# Patient Record
Sex: Female | Born: 1970 | Race: White | Hispanic: No | Marital: Married | Smoking: Never smoker
Health system: Southern US, Community
[De-identification: ages and names within clinical notes are randomized; demographics above are authoritative.]

## PROBLEM LIST (undated history)

## (undated) DIAGNOSIS — N83209 Unspecified ovarian cyst, unspecified side: Secondary | ICD-10-CM

## (undated) DIAGNOSIS — N920 Excessive and frequent menstruation with regular cycle: Secondary | ICD-10-CM

## (undated) DIAGNOSIS — K279 Peptic ulcer, site unspecified, unspecified as acute or chronic, without hemorrhage or perforation: Secondary | ICD-10-CM

## (undated) DIAGNOSIS — E7800 Pure hypercholesterolemia, unspecified: Secondary | ICD-10-CM

## (undated) DIAGNOSIS — D649 Anemia, unspecified: Secondary | ICD-10-CM

## (undated) HISTORY — PX: OTHER SURGICAL HISTORY: SHX169

## (undated) HISTORY — DX: Excessive and frequent menstruation with regular cycle: N92.0

## (undated) HISTORY — DX: Unspecified ovarian cyst, unspecified side: N83.209

## (undated) HISTORY — DX: Pure hypercholesterolemia, unspecified: E78.00

## (undated) HISTORY — DX: Peptic ulcer, site unspecified, unspecified as acute or chronic, without hemorrhage or perforation: K27.9

---

## 2005-06-06 ENCOUNTER — Inpatient Hospital Stay: Payer: Self-pay | Admitting: Obstetrics and Gynecology

## 2007-11-20 ENCOUNTER — Ambulatory Visit: Payer: Self-pay | Admitting: Internal Medicine

## 2007-11-20 ENCOUNTER — Emergency Department: Payer: Self-pay | Admitting: Emergency Medicine

## 2007-11-20 ENCOUNTER — Other Ambulatory Visit: Payer: Self-pay

## 2007-11-24 ENCOUNTER — Ambulatory Visit: Payer: Self-pay | Admitting: Ophthalmology

## 2009-03-19 ENCOUNTER — Ambulatory Visit: Payer: Self-pay

## 2009-03-21 ENCOUNTER — Ambulatory Visit: Payer: Self-pay

## 2009-07-21 HISTORY — PX: OOPHORECTOMY: SHX86

## 2009-07-21 HISTORY — PX: TUBAL LIGATION: SHX77

## 2010-04-24 ENCOUNTER — Ambulatory Visit: Payer: Self-pay

## 2010-05-10 ENCOUNTER — Ambulatory Visit: Payer: Self-pay | Admitting: Obstetrics and Gynecology

## 2010-05-17 ENCOUNTER — Ambulatory Visit: Payer: Self-pay | Admitting: Obstetrics and Gynecology

## 2012-10-01 LAB — HM PAP SMEAR: HM Pap smear: NEGATIVE

## 2013-09-20 ENCOUNTER — Ambulatory Visit: Payer: Self-pay | Admitting: Obstetrics and Gynecology

## 2013-10-07 LAB — BASIC METABOLIC PANEL
Glucose: 88 mg/dL
Potassium: 3.9 mmol/L (ref 3.4–5.3)

## 2013-10-07 LAB — HEMOGLOBIN A1C: Hgb A1c MFr Bld: 5.5 % (ref 4.0–6.0)

## 2013-10-07 LAB — LIPID PANEL
Cholesterol: 232 mg/dL — AB (ref 0–200)
HDL: 44 mg/dL (ref 35–70)
LDL Cholesterol: 170 mg/dL
Triglycerides: 92 mg/dL (ref 40–160)

## 2013-10-07 LAB — TSH: TSH: 1.25 u[IU]/mL (ref 0.41–5.90)

## 2015-01-31 ENCOUNTER — Encounter: Payer: Self-pay | Admitting: Obstetrics and Gynecology

## 2015-03-16 ENCOUNTER — Encounter: Payer: Self-pay | Admitting: *Deleted

## 2015-03-16 DIAGNOSIS — D500 Iron deficiency anemia secondary to blood loss (chronic): Secondary | ICD-10-CM

## 2015-03-21 ENCOUNTER — Encounter: Payer: Self-pay | Admitting: Obstetrics and Gynecology

## 2015-03-22 ENCOUNTER — Telehealth: Payer: Self-pay | Admitting: Obstetrics and Gynecology

## 2015-03-22 NOTE — Telephone Encounter (Signed)
Notified pt she voiced understanding 

## 2015-03-22 NOTE — Telephone Encounter (Signed)
PT IS COMING IN ON 9/16 FOR A F/U AND SHE FEELS THAT HER VITAMIN D IS LOW AND SHE WANTED TO KNOW HOW MUCH VITAMIN D SHE SHOULD TAKE OVER THE COUNTER UNTIL SHE COMES IN FOR HER VISIT.

## 2015-03-22 NOTE — Telephone Encounter (Signed)
5000 IU daily would work well.

## 2015-04-06 ENCOUNTER — Ambulatory Visit (INDEPENDENT_AMBULATORY_CARE_PROVIDER_SITE_OTHER): Payer: 59 | Admitting: Obstetrics and Gynecology

## 2015-04-06 ENCOUNTER — Other Ambulatory Visit: Payer: Self-pay | Admitting: Obstetrics and Gynecology

## 2015-04-06 ENCOUNTER — Encounter: Payer: Self-pay | Admitting: Obstetrics and Gynecology

## 2015-04-06 VITALS — BP 128/62 | HR 81 | Ht 61.0 in | Wt 182.5 lb

## 2015-04-06 DIAGNOSIS — E559 Vitamin D deficiency, unspecified: Secondary | ICD-10-CM

## 2015-04-06 DIAGNOSIS — Z01419 Encounter for gynecological examination (general) (routine) without abnormal findings: Secondary | ICD-10-CM | POA: Diagnosis not present

## 2015-04-06 DIAGNOSIS — N920 Excessive and frequent menstruation with regular cycle: Secondary | ICD-10-CM

## 2015-04-06 DIAGNOSIS — R5383 Other fatigue: Secondary | ICD-10-CM | POA: Diagnosis not present

## 2015-04-06 NOTE — Progress Notes (Signed)
ANNUAL PREVENTATIVE CARE GYN  ENCOUNTER NOTE  Subjective:       Misty Welch is a 44 y.o. No obstetric history on file. female here for a routine annual gynecologic exam.  Current complaints: 1.  Continued heavy menstrual periods with large clots monthly 2. Fatigue and depression around menses     Gynecologic History Patient's last menstrual period was 03/26/2015 (exact date). Contraception: tubal ligation Last Pap: 2015. Results were: normal Last mammogram: 2015. Results were: normal  Obstetric History OB History  No data available    Past Medical History  Diagnosis Date  . Elevated cholesterol   . Cyst of ovary   . Heavy menstrual bleeding     Past Surgical History  Procedure Laterality Date  . Tubal ligation  2011  . Oophorectomy  2011  . Cesarean section  2003,2006    No current outpatient prescriptions on file prior to visit.   No current facility-administered medications on file prior to visit.    Allergies  Allergen Reactions  . Macrobid [employer] Macro]   . Penicillins     Social History   Social History  . Marital Status: Married    Spouse Name: N/A  . Number of Children: N/A  . Years of Education: N/A   Occupational History  . Not on file.   Social History Main Topics  . Smoking status: Never Smoker   . Smokeless tobacco: Never Used  . Alcohol Use: No  . Drug Use: No  . Sexual Activity: Yes    Birth Control/ Protection: Surgical   Other Topics Concern  . Not on file   Social History Narrative    History reviewed. No pertinent family history.  The following portions of the patient's history were reviewed and updated as appropriate: allergies, current medications, past family history, past medical history, past social history, past surgical history and problem list.  Review of Systems ROS Review of Systems - General ROS: negative for - chills, fatigue, fever, hot flashes, night sweats, weight gain or weight  loss Psychological ROS: negative for - anxiety, decreased libido, depression, mood swings, physical abuse or sexual abuse Ophthalmic ROS: negative for - blurry vision, eye pain or loss of vision ENT ROS: negative for - headaches, hearing change, visual changes or vocal changes Allergy and Immunology ROS: negative for - hives, itchy/watery eyes or seasonal allergies Hematological and Lymphatic ROS: negative for - bleeding problems, bruising, swollen lymph nodes or weight loss Endocrine ROS: negative for - galactorrhea, hair pattern changes, hot flashes, malaise/lethargy, mood swings, palpitations, polydipsia/polyuria, skin changes, temperature intolerance or unexpected weight changes Breast ROS: negative for - new or changing breast lumps or nipple discharge Respiratory ROS: negative for - cough or shortness of breath Cardiovascular ROS: negative for - chest pain, irregular heartbeat, palpitations or shortness of breath Gastrointestinal ROS: no abdominal pain, change in bowel habits, or black or bloody stools Genito-Urinary ROS: no dysuria, trouble voiding, or hematuria Musculoskeletal ROS: negative for - joint pain or joint stiffness Neurological ROS: negative for - bowel and bladder control changes Dermatological ROS: negative for rash and skin lesion changes   Objective:   BP 128/62 mmHg  Pulse 81  Ht  (1.549 m)  Wt 182 lb 8 oz (82.781 kg)  BMI 34.50 kg/m2  LMP 03/26/2015 (Exact Date) CONSTITUTIONAL: Well-developed, well-nourished female in no acute distress.  PSYCHIATRIC: Normal mood and affect. Normal behavior. Normal judgment and thought content. NEUROLGIC: Alert and oriented to person, place, and time. Normal muscle  tone coordination. No cranial nerve deficit noted. HENT:  Normocephalic, atraumatic, External right and left ear normal. Oropharynx is clear and moist EYES: Conjunctivae and EOM are normal. Pupils are equal, round, and reactive to light. No scleral icterus.  NECK:  Normal range of motion, supple, no masses.  Normal thyroid.  SKIN: Skin is warm and dry. No rash noted. Not diaphoretic. No erythema. No pallor. CARDIOVASCULAR: Normal heart rate noted, regular rhythm, no murmur. RESPIRATORY: Clear to auscultation bilaterally. Effort and breath sounds normal, no problems with respiration noted. BREASTS: Symmetric in size. No masses, skin changes, nipple drainage, or lymphadenopathy. ABDOMEN: Soft, normal bowel sounds, no distention noted.  No tenderness, rebound or guarding.  BLADDER: Normal PELVIC:  External Genitalia: Normal  BUS: Normal  Vagina: Normal  Cervix: Normal  Uterus: Normal  Adnexa: Normal  RV: External Exam NormaI  MUSCULOSKELETAL: Normal range of motion. No tenderness.  No cyanosis, clubbing, or edema.  2+ distal pulses. LYMPHATIC: No Axillary, Supraclavicular, or Inguinal Adenopathy.    Assessment:   Annual gynecologic examination 44 y.o. Contraception: tubal ligation Obesity 2 Problem List Items Addressed This Visit    None    Visit Diagnoses    Well woman exam with routine gynecological exam    -  Primary    Relevant Orders    Thyroid Panel With TSH    Lipid panel    Comprehensive metabolic panel    Cytology - PAP    Hemoglobin A1c    MM DIGITAL SCREENING BILATERAL    Menorrhagia with regular cycle        Relevant Orders    CBC    Vitamin D deficiency        Relevant Orders    Vit D  25 hydroxy (rtn osteoporosis monitoring)    Other fatigue        Relevant Orders    B12       Plan:  Pap: Pap Co Test Mammogram: Ordered Stool Guaiac Testing:  Not Indicated Labs: Lipid 1, TSH, Hemoglobin A1C and Vit D Level""with vitamin B12 and comprehensive metabolic and CBC Routine preventative health maintenance measures emphasized: Exercise/Diet/Weight control and Stress Management Still not interested in surgical intervention for menorrhagia Return to Clinic - 1 Year   Abbigaile Rockman [place], [place]

## 2015-04-06 NOTE — Patient Instructions (Signed)
Thank you for enrolling in MyChart. Please follow the instructions below to securely access your online medical record. MyChart allows you to send messages to your doctor, view your test results, renew your prescriptions, schedule appointments, and more.  How Do I Sign Up? 1. In your Internet browser, go to [email or web address] 2. Click on the New  User? link in the Sign In box.  3. Enter your MyChart Access Code exactly as it appears below. You will not need to use this code after you have completed the sign-up process. If you do not sign up before the expiration date, you must request a new code. MyChart Access Code: [email or web address] Expires: 06/05/2015 10:26 AM  4. Enter the last four digits of your Social Security Number (xxxx) and Date of Birth (mm/dd/yyyy) as indicated and click Next. You will be taken to the next sign-up page. 5. Create a MyChart ID. This will be your MyChart login ID and cannot be changed, so think of one that is secure and easy to remember. 6. Create a MyChart password. You can change your password at any time. 7. Enter your Password Reset Question and Answer and click Next. This can be used at a later time if you forget your password.  8. Select your communication preference, and if applicable enter your e-mail address. You will receive e-mail notification when new information is available in MyChart by choosing to receive e-mail notifications and filling in your e-mail. 9. Click Sign In. You can now view your medical record.   Additional Information If you have questions, you can email [email or web address] or call [phone] to talk to our MyChart staff. Remember, MyChart is NOT to be used for urgent needs. For medical emergencies, dial 911.    Fat and Cholesterol Control Diet Fat and cholesterol levels in your blood and organs are influenced by your diet. High levels of fat and cholesterol may lead to diseases of the heart, small and  large blood vessels, gallbladder, liver, and pancreas. CONTROLLING FAT AND CHOLESTEROL WITH DIET Although exercise and lifestyle factors are important, your diet is key. That is because certain foods are known to raise cholesterol and others to lower it. The goal is to balance foods for their effect on cholesterol and more importantly, to replace saturated and trans fat with other types of fat, such as monounsaturated fat, polyunsaturated fat, and omega-3 fatty acids. On average, a person should consume no more than 15 to 17 g of saturated fat daily. Saturated and trans fats are considered "bad" fats, and they will raise LDL cholesterol. Saturated fats are primarily found in animal products such as meats, butter, and cream. However, that does not mean you need to give up all your favorite foods. Today, there are good tasting, low-fat, low-cholesterol substitutes for most of the things you like to eat. Choose low-fat or nonfat alternatives. Choose round or loin cuts of red meat. These types of cuts are lowest in fat and cholesterol. Chicken (without the skin), fish, veal, and ground [place] breast are great choices. Eliminate fatty meats, such as hot dogs and salami. Even shellfish have little or no saturated fat. Have a 3 oz (85 g) portion when you eat lean meat, poultry, or fish. Trans fats are also called "partially hydrogenated oils." They are oils that have been scientifically manipulated so that they are solid at room temperature resulting in a longer shelf life and improved taste and texture of foods in which they are added. Trans  fats are found in stick margarine, some tub margarines, cookies, crackers, and baked goods.  When baking and cooking, oils are a great substitute for butter. The monounsaturated oils are especially beneficial since it is believed they lower LDL and raise HDL. The oils you should avoid entirely are saturated tropical oils, such as coconut and palm.  Remember to eat a lot from  food groups that are naturally free of saturated and trans fat, including fish, fruit, vegetables, beans, grains (barley, rice, couscous, bulgur wheat), and pasta (without cream sauces).  IDENTIFYING FOODS THAT LOWER FAT AND CHOLESTEROL  Soluble fiber may lower your cholesterol. This type of fiber is found in fruits such as apples, vegetables such as broccoli, potatoes, and carrots, legumes such as beans, peas, and lentils, and grains such as barley. Foods fortified with plant sterols (phytosterol) may also lower cholesterol. You should eat at least 2 g per day of these foods for a cholesterol lowering effect.  Read package labels to identify low-saturated fats, trans fat free, and low-fat foods at the supermarket. Select cheeses that have only 2 to 3 g saturated fat per ounce. Use a heart-healthy tub margarine that is free of trans fats or partially hydrogenated oil. When buying baked goods (cookies, crackers), avoid partially hydrogenated oils. Breads and muffins should be made from whole grains (whole-wheat or whole oat flour, instead of "flour" or "enriched flour"). Buy non-creamy canned soups with reduced salt and no added fats.  FOOD PREPARATION TECHNIQUES  Never deep-fry. If you must fry, either stir-fry, which uses very little fat, or use non-stick cooking sprays. When possible, broil, bake, or roast meats, and steam vegetables. Instead of putting butter or margarine on vegetables, use lemon and herbs, applesauce, and cinnamon (for squash and sweet potatoes). Use nonfat yogurt, salsa, and low-fat dressings for salads.  LOW-SATURATED FAT / LOW-FAT FOOD SUBSTITUTES Meats / Saturated Fat (g)  Avoid: Steak, marbled (3 oz/85 g) / 11 g  Choose: Steak, lean (3 oz/85 g) / 4 g  Avoid: Hamburger (3 oz/85 g) / 7 g  Choose: Hamburger, lean (3 oz/85 g) / 5 g  Avoid: Ham (3 oz/85 g) / 6 g  Choose: Ham, lean cut (3 oz/85 g) / 2.4 g  Avoid: Chicken, with skin, dark meat (3 oz/85 g) / 4 g  Choose:  Chicken, skin removed, dark meat (3 oz/85 g) / 2 g  Avoid: Chicken, with skin, light meat (3 oz/85 g) / 2.5 g  Choose: Chicken, skin removed, light meat (3 oz/85 g) / 1 g Dairy / Saturated Fat (g)  Avoid: Whole milk (1 cup) / 5 g  Choose: Low-fat milk, 2% (1 cup) / 3 g  Choose: Low-fat milk, 1% (1 cup) / 1.5 g  Choose: Skim milk (1 cup) / 0.3 g  Avoid: Hard cheese (1 oz/28 g) / 6 g  Choose: Skim milk cheese (1 oz/28 g) / 2 to 3 g  Avoid: Cottage cheese, 4% fat (1 cup) / 6.5 g  Choose: Low-fat cottage cheese, 1% fat (1 cup) / 1.5 g  Avoid: Ice cream (1 cup) / 9 g  Choose: Sherbet (1 cup) / 2.5 g  Choose: Nonfat frozen yogurt (1 cup) / 0.3 g  Choose: Frozen fruit bar / trace  Avoid: Whipped cream (1 tbs) / 3.5 g  Choose: Nondairy whipped topping (1 tbs) / 1 g Condiments / Saturated Fat (g)  Avoid: Mayonnaise (1 tbs) / 2 g  Choose: Low-fat mayonnaise (1 tbs) / 1 g  Avoid: Butter (1 tbs) / 7 g  Choose: Extra light margarine (1 tbs) / 1 g  Avoid: Coconut oil (1 tbs) / 11.8 g  Choose: Olive oil (1 tbs) / 1.8 g  Choose: Corn oil (1 tbs) / 1.7 g  Choose: Safflower oil (1 tbs) / 1.2 g  Choose: Sunflower oil (1 tbs) / 1.4 g  Choose: Soybean oil (1 tbs) / 2.4 g  Choose: Canola oil (1 tbs) / 1 g Document Released: 07/07/2005 Document Revised: 11/01/2012 Document Reviewed: 10/05/2013 Exit[employer] Patient Information 2015 [place], [place]. This information is not intended to replace advice given to you by your health care provider. Make sure you discuss any questions you have with your health care provider. Menorrhagia Menorrhagia is the medical term for when your menstrual periods are heavy or last longer than usual. With menorrhagia, every period you have may cause enough blood loss and cramping that you are unable to maintain your usual activities. CAUSES  In some cases, the cause of heavy periods is unknown, but a number of conditions may cause menorrhagia. Common  causes include:  A problem with the hormone-producing thyroid gland (hypothyroid).  Noncancerous growths in the uterus (polyps or fibroids).  An imbalance of the estrogen and progesterone hormones.  One of your ovaries not releasing an egg during one or more months.  Side effects of having an intrauterine device (IUD).  Side effects of some medicines, such as anti-inflammatory medicines or blood thinners.  A bleeding disorder that stops your blood from clotting normally. SIGNS AND SYMPTOMS  During a normal period, bleeding lasts between 4 and 8 days. Signs that your periods are too heavy include:  You routinely have to change your pad or tampon every 1 or 2 hours because it is completely soaked.  You pass blood clots larger than 1 inch (2.5 cm) in size.  You have bleeding for more than 7 days.  You need to use pads and tampons at the same time because of heavy bleeding.  You need to wake up to change your pads or tampons during the night.  You have symptoms of anemia, such as tiredness, fatigue, or shortness of breath. DIAGNOSIS  Your health care provider will perform a physical exam and ask you questions about your symptoms and menstrual history. Other tests may be ordered based on what the health care provider finds during the exam. These tests can include:  Blood tests. Blood tests are used to check if you are pregnant or have hormonal changes, a bleeding or thyroid disorder, low iron levels (anemia), or other problems.  Endometrial biopsy. Your health care provider takes a sample of tissue from the inside of your uterus to be examined under a microscope.  Pelvic ultrasound. This test uses sound waves to make a picture of your uterus, ovaries, and vagina. The pictures can show if you have fibroids or other growths.  Hysteroscopy. For this test, your health care provider will use a small telescope to look inside your uterus. Based on the results of your initial tests, your  health care provider may recommend further testing. TREATMENT  Treatment may not be needed. If it is needed, your health care provider may recommend treatment with one or more medicines first. If these do not reduce bleeding enough, a surgical treatment might be an option. The best treatment for you will depend on:   Whether you need to prevent pregnancy.  Your desire to have children in the future.  The cause and severity of  your bleeding.  Your opinion and personal preference.  Medicines for menorrhagia may include:  Birth control methods that use hormones. These include the pill, skin patch, vaginal ring, shots that you get every 3 months, hormonal IUD, and implant. These treatments reduce bleeding during your menstrual period.  Medicines that thicken blood and slow bleeding.  Medicines that reduce swelling, such as ibuprofen.  Medicines that contain a synthetic hormone called progestin.   Medicines that make the ovaries stop working for a short time.  You may need surgical treatment for menorrhagia if the medicines are unsuccessful. Treatment options include:  Dilation and curettage (D&C). In this procedure, your health care provider opens (dilates) your cervix and then scrapes or suctions tissue from the lining of your uterus to reduce menstrual bleeding.  Operative hysteroscopy. This procedure uses a tiny tube with a light (hysteroscope) to view your uterine cavity and can help in the surgical removal of a polyp that may be causing heavy periods.  Endometrial ablation. Through various techniques, your health care provider permanently destroys the entire lining of your uterus (endometrium). After endometrial ablation, most women have little or no menstrual flow. Endometrial ablation reduces your ability to become pregnant.  Endometrial resection. This surgical procedure uses an electrosurgical wire loop to remove the lining of the uterus. This procedure also reduces your  ability to become pregnant.  Hysterectomy. Surgical removal of the uterus and cervix is a permanent procedure that stops menstrual periods. Pregnancy is not possible after a hysterectomy. This procedure requires anesthesia and hospitalization. HOME CARE INSTRUCTIONS   Only take over-the-counter or prescription medicines as directed by your health care provider. Take prescribed medicines exactly as directed. Do not change or switch medicines without consulting your health care provider.  Take any prescribed iron pills exactly as directed by your health care provider. Long-term heavy bleeding may result in low iron levels. Iron pills help replace the iron your body lost from heavy bleeding. Iron may cause constipation. If this becomes a problem, increase the bran, fruits, and roughage in your diet.  Do not take aspirin or medicines that contain aspirin 1 week before or during your menstrual period. Aspirin may make the bleeding worse.  If you need to change your sanitary pad or tampon more than once every 2 hours, stay in bed and rest as much as possible until the bleeding stops.  Eat well-balanced meals. Eat foods high in iron. Examples are leafy green vegetables, meat, liver, eggs, and whole grain breads and cereals. Do not try to lose weight until the abnormal bleeding has stopped and your blood iron level is back to normal. SEEK MEDICAL CARE IF:   You soak through a pad or tampon every 1 or 2 hours, and this happens every time you have a period.  You need to use pads and tampons at the same time because you are bleeding so much.  You need to change your pad or tampon during the night.  You have a period that lasts for more than 8 days.  You pass clots bigger than 1 inch wide.  You have irregular periods that happen more or less often than once a month.  You feel dizzy or faint.  You feel very weak or tired.  You feel short of breath or feel your heart is beating too fast when you  exercise.  You have nausea and vomiting or diarrhea while you are taking your medicine.  You have any problems that may be related to the  medicine you are taking. SEEK IMMEDIATE MEDICAL CARE IF:   You soak through 4 or more pads or tampons in 2 hours.  You have any bleeding while you are pregnant. MAKE SURE YOU:   Understand these instructions.  Will watch your condition.  Will get help right away if you are not doing well or get worse. Document Released: 07/07/2005 Document Revised: 07/12/2013 Document Reviewed: 12/26/2012 [facility] Patient Information 2015 [place], [place]. This information is not intended to replace advice given to you by your health care provider. Make sure you discuss any questions you have with your health care provider.

## 2015-04-07 LAB — COMPREHENSIVE METABOLIC PANEL
ALK PHOS: 73 IU/L (ref 39–117)
ALT: 14 IU/L (ref 0–32)
AST: 20 IU/L (ref 0–40)
Albumin/Globulin Ratio: 1.4 (ref 1.1–2.5)
Albumin: 4.3 g/dL (ref 3.5–5.5)
BILIRUBIN TOTAL: 0.3 mg/dL (ref 0.0–1.2)
BUN/Creatinine Ratio: 10 (ref 9–23)
BUN: 7 mg/dL (ref 6–24)
CHLORIDE: 100 mmol/L (ref 97–108)
CO2: 22 mmol/L (ref 18–29)
CREATININE: 0.67 mg/dL (ref 0.57–1.00)
Calcium: 9.2 mg/dL (ref 8.7–10.2)
GFR calc Af Amer: 125 mL/min/{1.73_m2} (ref >59)
GFR calc non Af Amer: 109 mL/min/{1.73_m2} (ref >59)
GLUCOSE: 83 mg/dL (ref 65–99)
Globulin, Total: 3 g/dL (ref 1.5–4.5)
Potassium: 4.2 mmol/L (ref 3.5–5.2)
Sodium: 138 mmol/L (ref 134–144)
Total Protein: 7.3 g/dL (ref 6.0–8.5)

## 2015-04-07 LAB — CBC
HEMATOCRIT: 26.8 % — AB (ref 34.0–46.6)
Hemoglobin: 7.8 g/dL — ABNORMAL LOW (ref 11.1–15.9)
MCH: 18.4 pg — ABNORMAL LOW (ref 26.6–33.0)
MCHC: 29.1 g/dL — ABNORMAL LOW (ref 31.5–35.7)
MCV: 63 fL — AB (ref 79–97)
Platelets: 425 10*3/uL — ABNORMAL HIGH (ref 150–379)
RBC: 4.23 x10E6/uL (ref 3.77–5.28)
RDW: 18 % — ABNORMAL HIGH (ref 12.3–15.4)
WBC: 8 10*3/uL (ref 3.4–10.8)

## 2015-04-07 LAB — THYROID PANEL WITH TSH
Free Thyroxine Index: 2.2 (ref 1.2–4.9)
T3 Uptake Ratio: 26 % (ref 24–39)
T4, Total: 8.3 ug/dL (ref 4.5–12.0)
TSH: 1.54 u[IU]/mL (ref 0.450–4.500)

## 2015-04-07 LAB — VITAMIN D 25 HYDROXY (VIT D DEFICIENCY, FRACTURES): Vit D, 25-Hydroxy: 30.5 ng/mL (ref 30.0–100.0)

## 2015-04-07 LAB — LIPID PANEL
CHOL/HDL RATIO: 4.7 ratio — AB (ref 0.0–4.4)
Cholesterol, Total: 207 mg/dL — ABNORMAL HIGH (ref 100–199)
HDL: 44 mg/dL (ref >39)
LDL CALC: 146 mg/dL — AB (ref 0–99)
Triglycerides: 86 mg/dL (ref 0–149)
VLDL CHOLESTEROL CAL: 17 mg/dL (ref 5–40)

## 2015-04-07 LAB — HEMOGLOBIN A1C
Est. average glucose Bld gHb Est-mCnc: 103 mg/dL
Hgb A1c MFr Bld: 5.2 % (ref 4.8–5.6)

## 2015-04-07 LAB — VITAMIN B12: Vitamin B-12: 563 pg/mL (ref 211–946)

## 2015-04-09 ENCOUNTER — Other Ambulatory Visit: Payer: Self-pay | Admitting: Obstetrics and Gynecology

## 2015-04-09 DIAGNOSIS — D500 Iron deficiency anemia secondary to blood loss (chronic): Secondary | ICD-10-CM

## 2015-04-09 LAB — CYTOLOGY - PAP

## 2015-04-09 MED ORDER — FUSION PLUS PO CAPS: 1.0000 | ORAL_CAPSULE | Freq: Every day | ORAL | Status: DC

## 2015-04-10 ENCOUNTER — Ambulatory Visit
Admission: RE | Admit: 2015-04-10 | Discharge: 2015-04-10 | Disposition: A | Discharge status: Home / Self Care | Payer: 59 | Source: Ambulatory Visit | Attending: Obstetrics and Gynecology | Admitting: Obstetrics and Gynecology

## 2015-04-10 DIAGNOSIS — Z01419 Encounter for gynecological examination (general) (routine) without abnormal findings: Secondary | ICD-10-CM

## 2015-04-10 DIAGNOSIS — Z1231 Encounter for screening mammogram for malignant neoplasm of breast: Secondary | ICD-10-CM | POA: Diagnosis not present

## 2015-04-12 ENCOUNTER — Telehealth: Payer: Self-pay | Admitting: Obstetrics and Gynecology

## 2015-04-12 ENCOUNTER — Telehealth: Payer: Self-pay | Admitting: *Deleted

## 2015-04-12 NOTE — Telephone Encounter (Signed)
Notified pt about her lab results and how important it is for her to take her iron tablet, pt voiced understanding and will come for repeat labs in 6 weeks Understands to call our office with any questions

## 2015-04-12 NOTE — Telephone Encounter (Signed)
-----   Message from Ulyses Amor, [place] sent at 04/10/2015  1:43 PM EDT ----- Please let her know I have reviewed her MMG and it is normal

## 2015-04-12 NOTE — Telephone Encounter (Signed)
Pt wants to know her vit D level

## 2015-04-12 NOTE — Telephone Encounter (Signed)
-----   Message from Ulyses Amor, [place] sent at 04/09/2015  4:30 PM EDT ----- Please let her know she is very anemic again- so low she would normally be recommended a transfusion- I sent in new rx for iron supplement, and want to recheck levels in 6 weeks- orders are in computer.  Would like her to reconsider treating heavy menses with either hormones or surgery.  Also her cholesterol is elevated- want her to work on weight loss and low chol diet.

## 2015-04-12 NOTE — Telephone Encounter (Signed)
Notified pt of results 

## 2015-04-13 ENCOUNTER — Telehealth: Payer: Self-pay | Admitting: Obstetrics and Gynecology

## 2015-04-13 NOTE — Telephone Encounter (Signed)
Patient called with questions regarding the iron supplement she was given.Thanks

## 2015-04-13 NOTE — Telephone Encounter (Signed)
Pt called back Misty Welch gave her Vit D result

## 2015-04-16 NOTE — Telephone Encounter (Signed)
Pt would like code for ablation to call her insurance and see how much it cost

## 2015-06-05 ENCOUNTER — Other Ambulatory Visit: Payer: Self-pay | Admitting: *Deleted

## 2015-06-05 ENCOUNTER — Telehealth: Payer: Self-pay | Admitting: Obstetrics and Gynecology

## 2015-06-05 MED ORDER — NORETHIN-ETH ESTRAD-FE BIPHAS 1 MG-10 MCG / 10 MCG PO TABS: 1.0000 | ORAL_TABLET | Freq: Every day | ORAL | Status: DC

## 2015-06-05 NOTE — Telephone Encounter (Signed)
Mel gave her samples for BC pills and she is out needs RX sent [facility]. She will need then tomorrow.

## 2015-06-05 NOTE — Telephone Encounter (Signed)
Done-ac 

## 2015-06-05 NOTE — Telephone Encounter (Signed)
Do you remember which sample you gave her??

## 2015-06-05 NOTE — Telephone Encounter (Signed)
minastrin or LoLoestrin?

## 2015-06-27 ENCOUNTER — Telehealth: Payer: Self-pay | Admitting: Obstetrics and Gynecology

## 2015-06-27 NOTE — Telephone Encounter (Signed)
Patient would like a call back, she has a couple questions regarding the birth control she was put on at her last visit. You can reach her at [phone]

## 2015-06-27 NOTE — Telephone Encounter (Signed)
Spoke with pt she needed some samples of her bc pills

## 2015-08-16 ENCOUNTER — Telehealth: Payer: Self-pay | Admitting: Obstetrics and Gynecology

## 2015-08-16 NOTE — Telephone Encounter (Signed)
Iron pills ran out. She wants to know if she should stay on them and if so can she have them refilled.

## 2015-08-16 NOTE — Telephone Encounter (Signed)
Pt needs to continue iron correct??

## 2015-08-22 NOTE — Telephone Encounter (Signed)
Yes, I want her to take them until we recheck her levels. The need to be drawn 6 months after last check.

## 2015-08-24 ENCOUNTER — Other Ambulatory Visit: Payer: Self-pay | Admitting: Obstetrics and Gynecology

## 2015-08-24 ENCOUNTER — Other Ambulatory Visit: Payer: 59

## 2015-08-24 ENCOUNTER — Other Ambulatory Visit: Payer: Self-pay | Admitting: *Deleted

## 2015-08-24 MED ORDER — FUSION PLUS PO CAPS: 1.0000 | ORAL_CAPSULE | Freq: Every day | ORAL | Status: DC

## 2015-08-25 LAB — FERRITIN: Ferritin: 47 ng/mL (ref 15–150)

## 2015-09-24 ENCOUNTER — Telehealth: Payer: Self-pay | Admitting: Obstetrics and Gynecology

## 2015-09-24 NOTE — Telephone Encounter (Signed)
Results please?

## 2015-09-24 NOTE — Telephone Encounter (Signed)
PT CALLED AND HAD BLOOD WORK ABOUT 3 WEEKS AGO, SHE STATED NOT SURE IF WE HAD TRIED CALLING HER BECAUSE HER CELL PHONE AND HOME PHONE MAILBOX WAS FULL, BUT SHE SAID SHE WILL TRY TO ANSWER HER PHONE IF YOU CALL HER BACK TODAY.

## 2015-09-25 NOTE — Telephone Encounter (Signed)
Please let her know her levels were normal, and that we have switch to messages about labs going through nmy Chart, I see she hasn't signed up yet, and encourage her to do so.

## 2015-09-27 NOTE — Telephone Encounter (Signed)
Notified pt of results 

## 2015-11-27 ENCOUNTER — Telehealth: Payer: Self-pay | Admitting: Obstetrics and Gynecology

## 2015-11-27 NOTE — Telephone Encounter (Signed)
She wants to ask about a side effect of LO LO

## 2015-11-28 ENCOUNTER — Telehealth: Payer: Self-pay | Admitting: *Deleted

## 2015-11-28 NOTE — Telephone Encounter (Signed)
See other message

## 2015-11-28 NOTE — Telephone Encounter (Signed)
Pt states she feel really bad on the birth control, she is going to stop them and let us know how she does

## 2015-11-28 NOTE — Telephone Encounter (Signed)
Patient states she is having issues with her birth control. She blood pressure has been elevated a little and she has been having headaches. Patient is requesting a call back . Call back number is [phone]

## 2016-02-04 ENCOUNTER — Other Ambulatory Visit: Payer: Self-pay | Admitting: *Deleted

## 2016-02-04 MED ORDER — NORETHIN-ETH ESTRAD-FE BIPHAS 1 MG-10 MCG / 10 MCG PO TABS: 1.0000 | ORAL_TABLET | Freq: Every day | ORAL | Status: DC

## 2016-03-03 ENCOUNTER — Telehealth: Payer: Self-pay | Admitting: Obstetrics and Gynecology

## 2016-03-03 NOTE — Telephone Encounter (Signed)
She wants your thoughts on taking OTC Amberen?

## 2016-03-04 NOTE — Telephone Encounter (Signed)
Looks like some thing for menopause??? pls advise

## 2016-03-04 NOTE — Telephone Encounter (Signed)
I think it is safe product, and should be fine to give it a try. Have about 50% of patients see results with it.

## 2016-03-04 NOTE — Telephone Encounter (Signed)
Left detailed message for pt 

## 2016-04-08 ENCOUNTER — Encounter: Payer: 59 | Admitting: Obstetrics and Gynecology

## 2016-04-09 ENCOUNTER — Encounter: Payer: 59 | Admitting: Obstetrics and Gynecology

## 2016-05-13 ENCOUNTER — Other Ambulatory Visit: Payer: Self-pay | Admitting: Obstetrics and Gynecology

## 2016-06-23 ENCOUNTER — Other Ambulatory Visit: Payer: Self-pay | Admitting: Obstetrics and Gynecology

## 2016-06-23 DIAGNOSIS — Z1231 Encounter for screening mammogram for malignant neoplasm of breast: Secondary | ICD-10-CM

## 2016-07-04 ENCOUNTER — Ambulatory Visit: Admission: RE | Admit: 2016-07-04 | Payer: 59 | Source: Ambulatory Visit

## 2016-07-18 ENCOUNTER — Encounter: Payer: Self-pay | Admitting: Obstetrics and Gynecology

## 2016-07-18 ENCOUNTER — Ambulatory Visit (INDEPENDENT_AMBULATORY_CARE_PROVIDER_SITE_OTHER): Payer: 59 | Admitting: Obstetrics and Gynecology

## 2016-07-18 VITALS — BP 144/87 | HR 92 | Ht 61.0 in | Wt 189.4 lb

## 2016-07-18 DIAGNOSIS — Z01419 Encounter for gynecological examination (general) (routine) without abnormal findings: Secondary | ICD-10-CM | POA: Diagnosis not present

## 2016-07-18 MED ORDER — NORETHIN-ETH ESTRAD-FE BIPHAS 1 MG-10 MCG / 10 MCG PO TABS: 1.0000 | ORAL_TABLET | Freq: Every day | ORAL | 11 refills | Status: DC

## 2016-07-18 NOTE — Progress Notes (Signed)
Subjective:   Misty Welch is a 45 y.o. 42P0 Caucasian female here for a routine well-woman exam.  No LMP recorded. Patient is not currently having periods (Reason: Oral contraceptives).    Current complaints: none now PCP: me       does desire labs  Social History: Sexual: heterosexual Marital Status: married Living situation: with family Occupation: self-employed Tobacco/alcohol: no tobacco use Illicit drugs: no history of illicit drug use  The following portions of the patient's history were reviewed and updated as appropriate: allergies, current medications, past family history, past medical history, past social history, past surgical history and problem list.  Past Medical History Past Medical History:  Diagnosis Date  . Cyst of ovary   . Elevated cholesterol   . Heavy menstrual bleeding     Past Surgical History Past Surgical History:  Procedure Laterality Date  . CESAREAN SECTION  [id number],2006  . OOPHORECTOMY  2011  . TUBAL LIGATION  2011    Gynecologic History G2P0  No LMP recorded. Patient is not currently having periods (Reason: Oral contraceptives). Contraception: OCP (estrogen/progesterone) Last Pap: 2016. Results were: normal Last mammogram: 2016. Results were: normal   Obstetric History OB History  Gravida Para Term Preterm AB Living  2            SAB TAB Ectopic Multiple Live Births               # Outcome Date GA Lbr Len/2nd Weight Sex Delivery Anes PTL Lv  2 Gravida 2006    F CS-Unspec     1 Gravida 2003    M CS-Unspec         Current Medications Current Outpatient Prescriptions on File Prior to Visit  Medication Sig Dispense Refill  . Norethindrone-Ethinyl Estradiol-Fe Biphas (LO LOESTRIN FE) 1 MG-10 MCG / 10 MCG tablet Take 1 tablet by mouth daily. 1 Package 11  . Iron-FA-B Cmp-C-Biot-Probiotic (FUSION PLUS) CAPS Take 1 capsule by mouth daily. (Patient not taking: Reported on 07/18/2016) 60 capsule 3   No current facility-administered  medications on file prior to visit.     Review of Systems Patient denies any headaches, blurred vision, shortness of breath, chest pain, abdominal pain, problems with bowel movements, urination, or intercourse.  Objective:  BP (!) 144/87   Pulse 92   Ht 5\' 1"  (1.549 m)   Wt 189 lb 6.4 oz (85.9 kg)   BMI 35.79 kg/m  Physical Exam  General:  Well developed, well nourished, no acute distress. She is alert and oriented x3. Skin:  Warm and dry Neck:  Midline trachea, no thyromegaly or nodules Cardiovascular: Regular rate and rhythm, no murmur heard Lungs:  Effort normal, all lung fields clear to auscultation bilaterally Breasts:  No dominant palpable mass, retraction, or nipple discharge Abdomen:  Soft, non tender, no hepatosplenomegaly or masses Pelvic:  External genitalia is normal in appearance.  The vagina is normal in appearance. The cervix is bulbous, no CMT.  Thin prep pap is not done. Uterus is felt to be normal size, shape, and contour.  No adnexal masses or tenderness noted.  Extremities:  No swelling or varicosities noted Psych:  She has a normal mood and affect  Assessment:   Healthy well-woman exam Obesity OCP user   Plan:  Declined flu shot F/U 1 year for AE, or sooner if needed Mammogram ordered  Leverett Camplin Suzan NailerN Emireth Cockerham, CNM

## 2016-07-18 NOTE — Patient Instructions (Signed)
Thank you for enrolling in MyChart. Please follow the instructions below to securely access your online medical record. MyChart allows you to send messages to your doctor, view your test results, renew your prescriptions, schedule appointments, and more.  How Do I Sign Up? 1. In your Internet browser, go to [email or web address]. 2. Click on the New  User? link in the Sign In box.  3. Enter your MyChart Access Code exactly as it appears below. You will not need to use this code after you have completed the sign-up process. If you do not sign up before the expiration date, you must request a new code. MyChart Access Code: [email or web address] Expires: 09/16/2016  9:22 AM  4. Enter the last four digits of your Social Security Number (xxxx) and Date of Birth (mm/dd/yyyy) as indicated and click Next. You will be taken to the next sign-up page. 5. Create a MyChart ID. This will be your MyChart login ID and cannot be changed, so think of one that is secure and easy to remember. 6. Create a MyChart password. You can change your password at any time. 7. Enter your Password Reset Question and Answer and click Next. This can be used at a later time if you forget your password.  8. Select your communication preference, and if applicable enter your e-mail address. You will receive e-mail notification when new information is available in MyChart by choosing to receive e-mail notifications and filling in your e-mail. 9. Click Sign In. You can now view your medical record.   Additional Information If you have questions, you can email [email or web address] or call [phone] to talk to our MyChart staff. Remember, MyChart is NOT to be used for urgent needs. For medical emergencies, dial 911.

## 2016-07-19 LAB — COMPREHENSIVE METABOLIC PANEL
A/G RATIO: 1.2 (ref 1.2–2.2)
ALT: 19 IU/L (ref 0–32)
AST: 23 IU/L (ref 0–40)
Albumin: 4.2 g/dL (ref 3.5–5.5)
Alkaline Phosphatase: 65 IU/L (ref 39–117)
BUN/Creatinine Ratio: 9 (ref 9–23)
BUN: 7 mg/dL (ref 6–24)
Bilirubin Total: 0.5 mg/dL (ref 0.0–1.2)
CALCIUM: 9 mg/dL (ref 8.7–10.2)
CHLORIDE: 101 mmol/L (ref 96–106)
CO2: 23 mmol/L (ref 18–29)
Creatinine, Ser: 0.75 mg/dL (ref 0.57–1.00)
GFR, EST AFRICAN AMERICAN: 112 mL/min/{1.73_m2} (ref >59)
GFR, EST NON AFRICAN AMERICAN: 97 mL/min/{1.73_m2} (ref >59)
GLUCOSE: 81 mg/dL (ref 65–99)
Globulin, Total: 3.4 g/dL (ref 1.5–4.5)
POTASSIUM: 4 mmol/L (ref 3.5–5.2)
Sodium: 141 mmol/L (ref 134–144)
TOTAL PROTEIN: 7.6 g/dL (ref 6.0–8.5)

## 2016-07-19 LAB — LIPID PANEL
CHOL/HDL RATIO: 5.5 ratio — AB (ref 0.0–4.4)
Cholesterol, Total: 243 mg/dL — ABNORMAL HIGH (ref 100–199)
HDL: 44 mg/dL (ref >39)
LDL CALC: 182 mg/dL — AB (ref 0–99)
Triglycerides: 87 mg/dL (ref 0–149)
VLDL Cholesterol Cal: 17 mg/dL (ref 5–40)

## 2016-07-19 LAB — IRON: IRON: 87 ug/dL (ref 27–159)

## 2016-07-19 LAB — HEMOGLOBIN A1C
ESTIMATED AVERAGE GLUCOSE: 94 mg/dL
Hgb A1c MFr Bld: 4.9 % (ref 4.8–5.6)

## 2016-07-19 LAB — CBC
Hematocrit: 41.6 % (ref 34.0–46.6)
Hemoglobin: 14.3 g/dL (ref 11.1–15.9)
MCH: 29.4 pg (ref 26.6–33.0)
MCHC: 34.4 g/dL (ref 31.5–35.7)
MCV: 86 fL (ref 79–97)
PLATELETS: 347 10*3/uL (ref 150–379)
RBC: 4.86 x10E6/uL (ref 3.77–5.28)
RDW: 12.4 % (ref 12.3–15.4)
WBC: 7.8 10*3/uL (ref 3.4–10.8)

## 2016-07-19 LAB — VITAMIN D 25 HYDROXY (VIT D DEFICIENCY, FRACTURES): Vit D, 25-Hydroxy: 28.1 ng/mL — ABNORMAL LOW (ref 30.0–100.0)

## 2016-07-19 LAB — VITAMIN B12: VITAMIN B 12: 482 pg/mL (ref 232–1245)

## 2016-07-22 ENCOUNTER — Telehealth: Payer: Self-pay | Admitting: *Deleted

## 2016-07-22 ENCOUNTER — Other Ambulatory Visit: Payer: Self-pay | Admitting: Obstetrics and Gynecology

## 2016-07-22 ENCOUNTER — Ambulatory Visit
Admission: RE | Admit: 2016-07-22 | Discharge: 2016-07-22 | Disposition: A | Discharge status: Home / Self Care | Payer: 59 | Source: Ambulatory Visit | Attending: Obstetrics and Gynecology | Admitting: Obstetrics and Gynecology

## 2016-07-22 DIAGNOSIS — Z1231 Encounter for screening mammogram for malignant neoplasm of breast: Secondary | ICD-10-CM | POA: Insufficient documentation

## 2016-07-22 DIAGNOSIS — E559 Vitamin D deficiency, unspecified: Secondary | ICD-10-CM | POA: Insufficient documentation

## 2016-07-22 MED ORDER — VITAMIN D (ERGOCALCIFEROL) 1.25 MG (50000 UNIT) PO CAPS: 50000.0000 [IU] | ORAL_CAPSULE | ORAL | 1 refills | Status: DC

## 2016-07-22 NOTE — Telephone Encounter (Signed)
Notified pt of results 

## 2016-07-22 NOTE — Telephone Encounter (Signed)
-----   Message from Purcell NailsMelody N Shambley, [place] sent at 07/22/2016 12:51 PM EST ----- Please let her know lab results, se needs to continue to work on lowering cholestero;l. Also vit D is low again. I will send in rx for weekly supplement.

## 2016-07-23 ENCOUNTER — Ambulatory Visit: Admission: RE | Admit: 2016-07-23 | Payer: 59 | Source: Ambulatory Visit

## 2017-02-10 ENCOUNTER — Other Ambulatory Visit: Payer: Self-pay | Admitting: Obstetrics and Gynecology

## 2017-05-23 ENCOUNTER — Observation Stay: Payer: 59

## 2017-05-23 ENCOUNTER — Emergency Department: Payer: 59

## 2017-05-23 ENCOUNTER — Encounter: Payer: Self-pay | Admitting: Emergency Medicine

## 2017-05-23 ENCOUNTER — Observation Stay
Admission: EM | Admit: 2017-05-23 | Discharge: 2017-05-24 | Disposition: A | Discharge status: Home / Self Care | Payer: 59 | Attending: General Surgery | Admitting: General Surgery

## 2017-05-23 DIAGNOSIS — R1011 Right upper quadrant pain: Principal | ICD-10-CM

## 2017-05-23 DIAGNOSIS — Z79899 Other long term (current) drug therapy: Secondary | ICD-10-CM | POA: Diagnosis not present

## 2017-05-23 DIAGNOSIS — Z880 Allergy status to penicillin: Secondary | ICD-10-CM | POA: Insufficient documentation

## 2017-05-23 DIAGNOSIS — Z7982 Long term (current) use of aspirin: Secondary | ICD-10-CM | POA: Insufficient documentation

## 2017-05-23 DIAGNOSIS — Z881 Allergy status to other antibiotic agents status: Secondary | ICD-10-CM | POA: Insufficient documentation

## 2017-05-23 DIAGNOSIS — K8020 Calculus of gallbladder without cholecystitis without obstruction: Secondary | ICD-10-CM | POA: Insufficient documentation

## 2017-05-23 DIAGNOSIS — E559 Vitamin D deficiency, unspecified: Secondary | ICD-10-CM | POA: Diagnosis not present

## 2017-05-23 DIAGNOSIS — R1010 Upper abdominal pain, unspecified: Secondary | ICD-10-CM

## 2017-05-23 DIAGNOSIS — R109 Unspecified abdominal pain: Secondary | ICD-10-CM | POA: Diagnosis present

## 2017-05-23 DIAGNOSIS — K819 Cholecystitis, unspecified: Secondary | ICD-10-CM

## 2017-05-23 LAB — COMPREHENSIVE METABOLIC PANEL
ALT: 27 U/L (ref 14–54)
ANION GAP: 9 (ref 5–15)
AST: 27 U/L (ref 15–41)
Albumin: 4.3 g/dL (ref 3.5–5.0)
Alkaline Phosphatase: 75 U/L (ref 38–126)
BUN: 11 mg/dL (ref 6–20)
CHLORIDE: 100 mmol/L — AB (ref 101–111)
CO2: 27 mmol/L (ref 22–32)
Calcium: 10 mg/dL (ref 8.9–10.3)
Creatinine, Ser: 0.93 mg/dL (ref 0.44–1.00)
GFR calc Af Amer: >60 mL/min (ref >60)
Glucose, Bld: 102 mg/dL — ABNORMAL HIGH (ref 65–99)
POTASSIUM: 4.2 mmol/L (ref 3.5–5.1)
Sodium: 136 mmol/L (ref 135–145)
Total Bilirubin: 0.8 mg/dL (ref 0.3–1.2)
Total Protein: 8.3 g/dL — ABNORMAL HIGH (ref 6.5–8.1)

## 2017-05-23 LAB — CBC
HCT: 41.6 % (ref 35.0–47.0)
Hemoglobin: 14.2 g/dL (ref 12.0–16.0)
MCH: 28.9 pg (ref 26.0–34.0)
MCHC: 34.2 g/dL (ref 32.0–36.0)
MCV: 84.6 fL (ref 80.0–100.0)
Platelets: 306 10*3/uL (ref 150–440)
RBC: 4.92 MIL/uL (ref 3.80–5.20)
RDW: 12 % (ref 11.5–14.5)
WBC: 13.7 10*3/uL — AB (ref 3.6–11.0)

## 2017-05-23 LAB — HCG, QUANTITATIVE, PREGNANCY: hCG, Beta Chain, Quant, S: 1 m[IU]/mL (ref <5)

## 2017-05-23 LAB — LIPASE, BLOOD: Lipase: 31 U/L (ref 11–51)

## 2017-05-23 LAB — TROPONIN I: Troponin I: <0.03 ng/mL (ref <0.03)

## 2017-05-23 MED ORDER — PROCHLORPERAZINE EDISYLATE 5 MG/ML IJ SOLN
5.0000 mg | Freq: Four times a day (QID) | INTRAMUSCULAR | Status: DC | PRN
  Administered 2017-05-23: 10 mg via INTRAVENOUS
  Filled 2017-05-23 (×2): qty 2

## 2017-05-23 MED ORDER — SODIUM CHLORIDE 0.9 % IV BOLUS (SEPSIS)
1000.0000 mL | Freq: Once | INTRAVENOUS | Status: AC
  Administered 2017-05-23: 1000 mL via INTRAVENOUS

## 2017-05-23 MED ORDER — ONDANSETRON 4 MG PO TBDP: 4.0000 mg | ORAL_TABLET | Freq: Four times a day (QID) | ORAL | Status: DC | PRN

## 2017-05-23 MED ORDER — METRONIDAZOLE IN NACL 5-0.79 MG/ML-% IV SOLN
500.0000 mg | Freq: Once | INTRAVENOUS | Status: AC
  Administered 2017-05-23: 500 mg via INTRAVENOUS
  Filled 2017-05-23: qty 100

## 2017-05-23 MED ORDER — DIPHENHYDRAMINE HCL 50 MG/ML IJ SOLN: 25.0000 mg | Freq: Four times a day (QID) | INTRAMUSCULAR | Status: DC | PRN

## 2017-05-23 MED ORDER — HYDROMORPHONE HCL 1 MG/ML IJ SOLN
0.5000 mg | INTRAMUSCULAR | Status: DC | PRN
Start: 2017-05-23 — End: 2017-05-24
  Administered 2017-05-23: 0.5 mg via INTRAVENOUS
  Filled 2017-05-23: qty 1

## 2017-05-23 MED ORDER — SODIUM CHLORIDE 0.9 % IV BOLUS (SEPSIS): 1000.0000 mL | Freq: Once | INTRAVENOUS | Status: DC

## 2017-05-23 MED ORDER — MORPHINE SULFATE (PF) 4 MG/ML IV SOLN
4.0000 mg | Freq: Once | INTRAVENOUS | Status: AC
  Administered 2017-05-23: 4 mg via INTRAVENOUS
  Filled 2017-05-23: qty 1

## 2017-05-23 MED ORDER — KETOROLAC TROMETHAMINE 15 MG/ML IJ SOLN
30.0000 mg | Freq: Four times a day (QID) | INTRAMUSCULAR | Status: DC | PRN
  Administered 2017-05-23: 30 mg via INTRAVENOUS
  Filled 2017-05-23: qty 2

## 2017-05-23 MED ORDER — CIPROFLOXACIN IN D5W 400 MG/200ML IV SOLN
400.0000 mg | Freq: Once | INTRAVENOUS | Status: AC
Start: 2017-05-23 — End: 2017-05-23
  Administered 2017-05-23: 400 mg via INTRAVENOUS
  Filled 2017-05-23: qty 200

## 2017-05-23 MED ORDER — CIPROFLOXACIN IN D5W 400 MG/200ML IV SOLN
400.0000 mg | Freq: Two times a day (BID) | INTRAVENOUS | Status: DC
  Administered 2017-05-23 – 2017-05-24 (×2): 400 mg via INTRAVENOUS
  Filled 2017-05-23 (×4): qty 200

## 2017-05-23 MED ORDER — IOPAMIDOL (ISOVUE-300) INJECTION 61%
100.0000 mL | Freq: Once | INTRAVENOUS | Status: AC | PRN
  Administered 2017-05-23: 100 mL via INTRAVENOUS

## 2017-05-23 MED ORDER — ONDANSETRON HCL 4 MG/2ML IJ SOLN
4.0000 mg | Freq: Once | INTRAMUSCULAR | Status: AC
  Administered 2017-05-23: 4 mg via INTRAVENOUS
  Filled 2017-05-23: qty 2

## 2017-05-23 MED ORDER — ONDANSETRON HCL 4 MG/2ML IJ SOLN
4.0000 mg | Freq: Four times a day (QID) | INTRAMUSCULAR | Status: DC | PRN
  Administered 2017-05-23: 4 mg via INTRAVENOUS
  Filled 2017-05-23 (×2): qty 2

## 2017-05-23 MED ORDER — DIPHENHYDRAMINE HCL 25 MG PO CAPS: 25.0000 mg | ORAL_CAPSULE | Freq: Four times a day (QID) | ORAL | Status: DC | PRN

## 2017-05-23 MED ORDER — DEXTROSE IN LACTATED RINGERS 5 % IV SOLN
INTRAVENOUS | Status: DC
  Administered 2017-05-23 – 2017-05-24 (×2): via INTRAVENOUS

## 2017-05-23 MED ORDER — PROCHLORPERAZINE MALEATE 10 MG PO TABS
10.0000 mg | ORAL_TABLET | Freq: Four times a day (QID) | ORAL | Status: DC | PRN
  Filled 2017-05-23: qty 1

## 2017-05-23 MED ORDER — METRONIDAZOLE IN NACL 5-0.79 MG/ML-% IV SOLN
500.0000 mg | Freq: Three times a day (TID) | INTRAVENOUS | Status: DC
  Administered 2017-05-23 – 2017-05-24 (×3): 500 mg via INTRAVENOUS
  Filled 2017-05-23 (×5): qty 100

## 2017-05-23 MED ORDER — PANTOPRAZOLE SODIUM 40 MG IV SOLR
40.0000 mg | INTRAVENOUS | Status: DC
Start: 2017-05-23 — End: 2017-05-24
  Administered 2017-05-23: 40 mg via INTRAVENOUS
  Filled 2017-05-23 (×2): qty 40

## 2017-05-23 MED ORDER — IOPAMIDOL (ISOVUE-300) INJECTION 61%
15.0000 mL | INTRAVENOUS | Status: AC
  Administered 2017-05-23 (×2): 15 mL via ORAL

## 2017-05-23 MED ORDER — HYDRALAZINE HCL 20 MG/ML IJ SOLN: 10.0000 mg | INTRAMUSCULAR | Status: DC | PRN

## 2017-05-23 NOTE — ED Notes (Signed)
Attempted to call report to the floor. 

## 2017-05-23 NOTE — H&P (Signed)
Patient ID: Misty CabalSusan B Fettes, female   DOB: 1971-01-29, 46 y.o.   MRN: [id number]  CC: Abdominal pain  HPI Misty Welch is a 46 y.o. female who presents the emergency department for evaluation of abdominal pain.  Patient reports that the pain started last night.  She never had any pain like this before.  It was associated with chills, nausea, vomiting.  She is unsure whether or not she had a fever.  She denies any diarrhea, constipation, chest pain, shortness of breath.  The pain is a constant pressure pain to her mid epigastrium going through her back.  Nothing is made it better or worse.  The pain started about 3:00 in the afternoon yesterday after drinking coffee.  She was able to eat dinner last night without obvious effect to the pain.  She denies any recent travel or sick contacts.  HPI  Past Medical History:  Diagnosis Date  . Cyst of ovary   . Elevated cholesterol   . Heavy menstrual bleeding     Past Surgical History:  Procedure Laterality Date  . CESAREAN SECTION  [id number],2006  . OOPHORECTOMY  2011  . TUBAL LIGATION  2011    Family History  Problem Relation Age of Onset  . Heart disease Maternal Grandmother   No other family history known of diabetes or cancers.  Social History Social History  Substance Use Topics  . Smoking status: Never Smoker  . Smokeless tobacco: Never Used  . Alcohol use No    Allergies  Allergen Reactions  . Macrobid [Nitrofurantoin Monohyd Macro] Anaphylaxis  . Penicillins     Childhood reaction    Current Facility-Administered Medications  Medication Dose Route Frequency Provider Last Rate Last Dose  . ciprofloxacin (CIPRO) IVPB 400 mg  400 mg Intravenous Once Myrna BlazerSchaevitz, David Matthew, MD      . metroNIDAZOLE (FLAGYL) IVPB 500 mg  500 mg Intravenous Once Myrna BlazerSchaevitz, David Matthew, MD 100 mL/hr at 05/23/17 1028 500 mg at 05/23/17 1028   Current Outpatient Prescriptions  Medication Sig Dispense Refill  . Aspirin-Acetaminophen-Caffeine  500-325-65 MG PACK Take 1 Package by mouth as needed.    . LO LOESTRIN FE 1 MG-10 MCG / 10 MCG tablet TAKE 1 TABLET BY MOUTH DAILY. 28 tablet 6  . naproxen sodium (ALEVE) 220 MG tablet Take 220 mg by mouth 2 (two) times daily as needed.    . Iron-FA-B Cmp-C-Biot-Probiotic (FUSION PLUS) CAPS Take 1 capsule by mouth daily. (Patient not taking: Reported on 07/18/2016) 60 capsule 3  . Vitamin D, Ergocalciferol, (DRISDOL) 50000 units CAPS capsule Take 1 capsule (50,000 Units total) by mouth every 7 (seven) days. (Patient not taking: Reported on 05/23/2017) 30 capsule 1     Review of Systems A multi-point review of systems was asked and was negative except for the findings documented in the HPI  Physical Exam Blood pressure (!) 151/94, pulse 79, temperature 97.8 F (36.6 C), temperature source Oral, resp. rate 16, height 5\' 1"  (1.549 m), weight 81.6 kg (180 lb), SpO2 100 %. CONSTITUTIONAL: No acute distress. EYES: Pupils are equal, round, and reactive to light, Sclera are non-icteric. EARS, NOSE, MOUTH AND THROAT: The oropharynx is clear. The oral mucosa is pink and moist. Hearing is intact to voice. LYMPH NODES:  Lymph nodes in the neck are normal. RESPIRATORY:  Lungs are clear. There is normal respiratory effort, with equal breath sounds bilaterally, and without pathologic use of accessory muscles. CARDIOVASCULAR: Heart is regular without murmurs, gallops, or rubs. GI: The  abdomen is soft, tender to palpation in the midepigastric region with a negative Murphy sign no rebound, guarding, and nondistended. There are no palpable masses. There is no hepatosplenomegaly. There are normal bowel sounds in all quadrants. GU: Rectal deferred.   MUSCULOSKELETAL: Normal muscle strength and tone. No cyanosis or edema.   SKIN: Turgor is good and there are no pathologic skin lesions or ulcers. NEUROLOGIC: Motor and sensation is grossly normal. Cranial nerves are grossly intact. PSYCH:  Oriented to person, place  and time. Affect is normal.  Data Reviewed Images and labs reviewed which are significant for a white blood cell count of 13.7 but the remainder of her labs including LFTs are within normal limits.  Ultrasound of the abdomen shows a 4 cm gallstone within the body of the gallbladder.  Borderline gallbladder wall thickness measures 4.1 mm when I check it measured greater than 6 mm.  The ultrasound technologist.  Question of pericholecystic fluid but no ductal dilatation. I have personally reviewed the patient's imaging, laboratory findings and medical records.    Assessment    Abdominal pain    Plan    46 year old female with abdominal pain and leukocytosis with now documented cholelithiasis.  History and physical exam are not 100% consistent with cholecystitis.  Discussed the differential with the patient to include biliary disorders, peptic ulcer disease, gastritis, enteritis.  Given this list of possibilities discussed her options and recommended to bring her into the hospital under observation for IV fluids, IV antibiotics, IV acid suppression, CT scan of the abdomen and pelvis with contrast.  Discussed that should no other cause for her pain other than her cholelithiasis be discovered then a cholecystectomy would be recommended.  Patient voiced understanding and agrees with this plan.  Discussed the case with the ER provider.     Time spent with the patient was 50 minutes, with more than 50% of the time spent in face-to-face education, counseling and care coordination.     Ricarda Frame, MD FACS General Surgeon 05/23/2017, 11:17 AM

## 2017-05-23 NOTE — ED Triage Notes (Signed)
Severe epigastric pain since last night 10pm. Nausea and vomiting.

## 2017-05-23 NOTE — ED Provider Notes (Signed)
[facility] Emergency Department Provider Note  ____________________________________________   First MD Initiated Contact with Patient 05/23/17 [phone]     (approximate)  I have reviewed the triage vital signs and the nursing notes.   HISTORY  Chief Complaint Abdominal Pain   HPI Misty Welch is a 46 y.o. female with a history of ovarian cyst as well as heavy menstrual bleeding on an estrogen cream who is presenting with upper abdominal pain since about 3 PM yesterday.  Says that she drank a [employer] coffee at that time and then began having an uneasy feeling in her upper abdomen.  She then progressed to overt pain in the upper abdomen at 10 PM which has continued onward to this morning.  She says the pain is a "12 out of 10 at this time."  Pain is nonradiating.  Patient denies any diarrhea.  Denies any shortness of breath.    Past Medical History:  Diagnosis Date  . Cyst of ovary   . Elevated cholesterol   . Heavy menstrual bleeding     Patient Active Problem List   Diagnosis Date Noted  . Vitamin D deficiency 07/22/2016    Past Surgical History:  Procedure Laterality Date  . CESAREAN SECTION  [id number]  . OOPHORECTOMY  2011  . TUBAL LIGATION  2011    Prior to Admission medications   Medication Sig Start Date End Date Taking? Authorizing Provider  Iron-FA-B Cmp-C-Biot-Probiotic (FUSION PLUS) CAPS Take 1 capsule by mouth daily. Patient not taking: Reported on 07/18/2016 08/24/15   Shambley, [name] N, CNM  LO LOESTRIN FE 1 MG-10 MCG / 10 MCG tablet TAKE 1 TABLET BY MOUTH DAILY. 02/10/17   Shambley, [name] N, CNM  Vitamin D, Ergocalciferol, (DRISDOL) 50000 units CAPS capsule Take 1 capsule (50,000 Units total) by mouth every 7 (seven) days. 07/22/16   Shambley, [name] N, CNM    Allergies Macrobid [nitrofurantoin monohyd macro] and Penicillins  Family History  Problem Relation Age of Onset  . Heart disease Maternal Grandmother     Social  History Social History  Substance Use Topics  . Smoking status: Never Smoker  . Smokeless tobacco: Never Used  . Alcohol use No    Review of Systems  Constitutional: No fever/chills Eyes: No visual changes. ENT: No sore throat. Cardiovascular: Denies chest pain. Respiratory: Denies shortness of breath. Gastrointestinal:  No diarrhea.  No constipation. Genitourinary: Negative for dysuria. Musculoskeletal: Negative for back pain. Skin: Negative for rash. Neurological: Negative for headaches, focal weakness or numbness.   ____________________________________________   PHYSICAL EXAM:  VITAL SIGNS: ED Triage Vitals  Enc Vitals Group     BP 05/23/17 0711 (!) 162/89     Pulse Rate 05/23/17 0711 78     Resp 05/23/17 0711 18     Temp 05/23/17 0711 97.8 F (36.6 C)     Temp Source 05/23/17 0711 Oral     SpO2 05/23/17 0711 100 %     Weight 05/23/17 0712 180 lb (81.6 kg)     Height 05/23/17 0712 5\' 1"  (1.549 m)     Head Circumference --      Peak Flow --      Pain Score 05/23/17 0710 10     Pain Loc --      Pain Edu? --      Excl. in GC? --     Constitutional: Alert and oriented.  Patient is visibly uncomfortable Eyes: Conjunctivae are normal.  Head: Atraumatic. Nose: No congestion/rhinnorhea. Mouth/Throat:  Mucous membranes are moist.  Neck: No stridor.   Cardiovascular: Normal rate, regular rhythm. Grossly normal heart sounds. Respiratory: Normal respiratory effort.  No retractions. Lungs CTAB. Gastrointestinal: Soft with tenderness to the right upper quadrant with a negative Murphy sign. No distention. No CVA tenderness. Musculoskeletal: No lower extremity tenderness nor edema.  No joint effusions. Neurologic:  Normal speech and language. No gross focal neurologic deficits are appreciated. Skin:  Skin is warm, dry and intact. No rash noted. Psychiatric: Mood and affect are normal. Speech and behavior are normal.  ____________________________________________    LABS (all labs ordered are listed, but only abnormal results are displayed)  Labs Reviewed  COMPREHENSIVE METABOLIC PANEL - Abnormal; Notable for the following:       Result Value   Chloride 100 (*)    Glucose, Bld 102 (*)    Total Protein 8.3 (*)    All other components within normal limits  CBC - Abnormal; Notable for the following:    WBC 13.7 (*)    All other components within normal limits  LIPASE, BLOOD  TROPONIN I   ____________________________________________  EKG  ED ECG REPORT I, Arelia LongestSchaevitz,  Lindzee Gouge M, the attending physician, personally viewed and interpreted this ECG.   Date: 05/23/2017  EKG Time: 721  Rate: 89  Rhythm: normal sinus rhythm  Axis: Normal  Intervals:none  ST&T Change: No ST segment elevation or depression.  No abnormal T wave inversion.  ____________________________________________  RADIOLOGY  4 cm gallstone in the context of cholecystitis on the ultrasound ____________________________________________   PROCEDURES  Procedure(s) performed:   Procedures  Critical Care performed:   ____________________________________________   INITIAL IMPRESSION / ASSESSMENT AND PLAN / ED COURSE  Pertinent labs & imaging results that were available during my care of the patient were reviewed by me and considered in my medical decision making (see chart for details).  Differential diagnosis includes, but is not limited to, biliary disease (biliary colic, acute cholecystitis, cholangitis, choledocholithiasis, etc), intrathoracic causes for epigastric abdominal pain including ACS, gastritis, duodenitis, pancreatitis, small bowel or large bowel obstruction, abdominal aortic aneurysm, hernia, and gastritis.  As part of my medical decision making, I reviewed the following data within the electronic MEDICAL RECORD NUMBER Old chart reviewed  ----------------------------------------- 10:16 AM on 05/23/2017 -----------------------------------------  Patient  says the pain is coming back at this time.  Ultrasound appears to reveal cholecystitis.  Patient will be admitted to the hospital.  Discussed the case with the surgeon, Dr. Tonita CongWoodham.        ____________________________________________   FINAL CLINICAL IMPRESSION(S) / ED DIAGNOSES  Final diagnoses:  RUQ pain    Cholecystitis  NEW MEDICATIONS STARTED DURING THIS VISIT:  New Prescriptions   No medications on file     Note:  This document was prepared using Dragon voice recognition software and may include unintentional dictation errors.     Myrna BlazerSchaevitz, Izabellah Dadisman Matthew, MD 05/23/17 1016

## 2017-05-24 ENCOUNTER — Other Ambulatory Visit: Payer: Self-pay

## 2017-05-24 DIAGNOSIS — R1010 Upper abdominal pain, unspecified: Secondary | ICD-10-CM | POA: Diagnosis not present

## 2017-05-24 LAB — CBC
HCT: 39.1 % (ref 35.0–47.0)
HEMOGLOBIN: 13.5 g/dL (ref 12.0–16.0)
MCH: 29.6 pg (ref 26.0–34.0)
MCHC: 34.6 g/dL (ref 32.0–36.0)
MCV: 85.7 fL (ref 80.0–100.0)
Platelets: 266 10*3/uL (ref 150–440)
RBC: 4.56 MIL/uL (ref 3.80–5.20)
RDW: 12.1 % (ref 11.5–14.5)
WBC: 9.1 10*3/uL (ref 3.6–11.0)

## 2017-05-24 LAB — COMPREHENSIVE METABOLIC PANEL
ALT: 28 U/L (ref 14–54)
ANION GAP: 6 (ref 5–15)
AST: 26 U/L (ref 15–41)
Albumin: 3.5 g/dL (ref 3.5–5.0)
Alkaline Phosphatase: 71 U/L (ref 38–126)
BUN: 7 mg/dL (ref 6–20)
CHLORIDE: 105 mmol/L (ref 101–111)
CO2: 28 mmol/L (ref 22–32)
CREATININE: 0.83 mg/dL (ref 0.44–1.00)
Calcium: 8.7 mg/dL — ABNORMAL LOW (ref 8.9–10.3)
Glucose, Bld: 106 mg/dL — ABNORMAL HIGH (ref 65–99)
POTASSIUM: 3.8 mmol/L (ref 3.5–5.1)
SODIUM: 139 mmol/L (ref 135–145)
Total Bilirubin: 1 mg/dL (ref 0.3–1.2)
Total Protein: 6.9 g/dL (ref 6.5–8.1)

## 2017-05-24 MED ORDER — METRONIDAZOLE 500 MG PO TABS: 500.0000 mg | ORAL_TABLET | Freq: Three times a day (TID) | ORAL | 0 refills | Status: DC

## 2017-05-24 MED ORDER — CIPROFLOXACIN HCL 500 MG PO TABS
500.0000 mg | ORAL_TABLET | Freq: Two times a day (BID) | ORAL | Status: DC
  Administered 2017-05-24: 500 mg via ORAL
  Filled 2017-05-24: qty 1

## 2017-05-24 MED ORDER — METRONIDAZOLE 500 MG PO TABS
500.0000 mg | ORAL_TABLET | Freq: Three times a day (TID) | ORAL | Status: DC
  Administered 2017-05-24: 500 mg via ORAL
  Filled 2017-05-24 (×2): qty 1

## 2017-05-24 MED ORDER — CIPROFLOXACIN HCL 500 MG PO TABS
500.0000 mg | ORAL_TABLET | Freq: Two times a day (BID) | ORAL | 0 refills | Status: DC
Start: 2017-05-25 — End: 2017-08-19

## 2017-05-24 MED ORDER — PANTOPRAZOLE SODIUM 40 MG PO TBEC: 40.0000 mg | DELAYED_RELEASE_TABLET | Freq: Two times a day (BID) | ORAL | 3 refills | Status: DC

## 2017-05-24 MED ORDER — PANTOPRAZOLE SODIUM 40 MG PO TBEC
40.0000 mg | DELAYED_RELEASE_TABLET | Freq: Two times a day (BID) | ORAL | Status: DC
  Administered 2017-05-24: 40 mg via ORAL
  Filled 2017-05-24: qty 1

## 2017-05-24 NOTE — Discharge Summary (Signed)
Patient ID: Misty Welch MRN: [id number] DOB/AGE: 45/06/1971 45 y.o.  Admit date: 05/23/2017 Discharge date: 05/24/2017  Discharge Diagnoses:  Abdominal Pain  Procedures Performed: None  Discharged Condition: good  Hospital Course: Patient brought into the hospital with abdominal pain. Started on treatment for both enteritis and peptic ulcer disease. Symptoms fully resolved the first night in the hospital and she was pain free and tolerating a regular diet at the time of discharge.  Discharge Orders: Discharge Instructions    Call MD for:  persistant nausea and vomiting   Complete by:  As directed    Call MD for:  severe uncontrolled pain   Complete by:  As directed    Call MD for:  temperature >100.4   Complete by:  As directed    Diet - low sodium heart healthy   Complete by:  As directed    Increase activity slowly   Complete by:  As directed       Disposition:   Discharge Medications: Allergies as of 05/24/2017      Reactions   Macrobid [nitrofurantoin Monohyd Macro] Anaphylaxis   Penicillins    Childhood reaction      Medication List    TAKE these medications   Aspirin-Acetaminophen-Caffeine 500-325-65 MG Pack Take 1 Package by mouth as needed.   ciprofloxacin 500 MG tablet Commonly known as:  CIPRO Take 1 tablet (500 mg total) 2 (two) times daily by mouth. Start taking on:  05/25/2017   FUSION PLUS Caps Take 1 capsule by mouth daily.   LO LOESTRIN FE 1 MG-10 MCG / 10 MCG tablet Generic drug:  Norethindrone-Ethinyl Estradiol-Fe Biphas TAKE 1 TABLET BY MOUTH DAILY.   metroNIDAZOLE 500 MG tablet Commonly known as:  FLAGYL Take 1 tablet (500 mg total) every 8 (eight) hours by mouth.   naproxen sodium 220 MG tablet Commonly known as:  ALEVE Take 220 mg by mouth 2 (two) times daily as needed.   pantoprazole 40 MG tablet Commonly known as:  PROTONIX Take 1 tablet (40 mg total) 2 (two) times daily by mouth.   Vitamin D (Ergocalciferol) 50000 units  Caps capsule Commonly known as:  DRISDOL Take 1 capsule (50,000 Units total) by mouth every 7 (seven) days.        Follwup: Follow-up Information    Ricarda FrameWoodham, Audrie Kuri, MD. Go in 16 day(s).   Specialty:  General Surgery Why:  Report to clinic in [place] at 9:15 for a 9:30 AM appt on Tuesday Nov 20 with Dr. Luna KitchensWoodham Contact information: [address] [place] [id number] [phone]           Signed: Ricarda FrameCharles Sunshyne Horvath 05/24/2017, 3:28 PM

## 2017-05-24 NOTE — Discharge Instructions (Signed)

## 2017-05-24 NOTE — Progress Notes (Signed)
CC: Abdominal pain Subjective: Patient reports that her pain has completely resolved.  She states that after sleeping the night she woke up without pain.  She states she is moderately hungry and desires something to drink.  She has been drinking water and ginger ale provided by the nurses.  She denies any fevers, chills, nausea, vomiting.  Objective: Vital signs in last 24 hours: Temp:  [97.9 F (36.6 C)-98.4 F (36.9 C)] 98.3 F (36.8 C) (11/04 0514) Pulse Rate:  [79-91] 84 (11/04 0514) Resp:  [16-17] 17 (11/03 1400) BP: (120-151)/(64-94) 120/71 (11/04 0514) SpO2:  [98 %-100 %] 98 % (11/04 0514) Last BM Date: 05/22/17  Intake/Output from previous day: 11/03 0701 - 11/04 0700 In: 3210.1 [I.V.:1610.1; IV Piggyback:1600] Out: 500 [Urine:500] Intake/Output this shift: No intake/output data recorded.  Physical exam:  General: No acute distress Chest: Clear to auscultation Heart: Regular rate and rhythm Abdomen: Soft, nontender, nondistended  Lab Results: CBC  Recent Labs    05/23/17 0718 05/24/17 0425  WBC 13.7* 9.1  HGB 14.2 13.5  HCT 41.6 39.1  PLT 306 266   BMET Recent Labs    05/23/17 0718 05/24/17 0425  NA 136 139  K 4.2 3.8  CL 100* 105  CO2 27 28  GLUCOSE 102* 106*  BUN 11 7  CREATININE 0.93 0.83  CALCIUM 10.0 8.7*   PT/INR No results for input(s): LABPROT, INR in the last 72 hours. ABG No results for input(s): PHART, HCO3 in the last 72 hours.  Invalid input(s): PCO2, PO2  Studies/Results: Ct Abdomen Pelvis W Contrast  Result Date: 05/23/2017 CLINICAL DATA:  Abdominal pain. Gastroenteritis or colitis suspected. Abnormal ultrasound. EXAM: CT ABDOMEN AND PELVIS WITH CONTRAST TECHNIQUE: Multidetector CT imaging of the abdomen and pelvis was performed using the standard protocol following bolus administration of intravenous contrast. CONTRAST:  ISOVUE-300 IOPAMIDOL (ISOVUE-300) INJECTION 61% COMPARISON:  None. FINDINGS: Lower chest: The lung  bases are clear. The heart size is normal. No significant pleural or pericardial effusion is present. Hepatobiliary: A gallstone measures 2.7 cm on CT. There is mild wall thickening of the gallbladder versus mild pole like pericholecystic fluid. The liver is unremarkable. The common bile duct is within normal limits. Pancreas: Unremarkable. No pancreatic ductal dilatation or surrounding inflammatory changes. Spleen: Normal in size without focal abnormality. Adrenals/Urinary Tract: The adrenal glands are normal bilaterally. Kidneys and ureters are within normal limits. The urinary bladder is within normal limits. Stomach/Bowel: The stomach is moderately distended. No obstructing lesion is present. The duodenum is unremarkable. The small bowel is within normal limits. The appendix and cecum are visualized and within normal limits. The ascending and transverse colon are normal. The descending and sigmoid colon are within normal limits. Vascular/Lymphatic: No significant vascular findings are present. No enlarged abdominal or pelvic lymph nodes. Reproductive: Uterus and bilateral adnexa are unremarkable. Musculoskeletal: Tear body heights and alignment are maintained. Mild rightward curvature of the thoracolumbar spine centered at T12. Asymmetric endplate changes are present on the right at L5-S1. IMPRESSION: 1. 2.7 cm gallstone with gallbladder wall thickening or pericholecystic fluid remains concerning for acute cholecystitis. 2. No other acute or focal abnormality to explain the patient's epigastric pain. 3. Mild curvature of the thoracolumbar spine. Electronically Signed   By: Marin Roberts M.D.   On: 05/23/2017 21:29   US Abdomen Limited Ruq  Result Date: 05/23/2017 CLINICAL DATA:  Right upper quadrant pain. EXAM: ULTRASOUND ABDOMEN LIMITED RIGHT UPPER QUADRANT COMPARISON:  None. FINDINGS: Gallbladder: There is a single  4 cm stone in the gallbladder. The gallbladder wall measures 6.4 mm which is  thickened. The wall appears edematous. A tiny amount of pericholecystic fluid is not excluded. No Murphy's sign reported. Common bile duct: Diameter: 4 mm Liver: No focal lesion identified. Within normal limits in parenchymal echogenicity. Portal vein is patent on color Doppler imaging with normal direction of blood flow towards the liver. IMPRESSION: 1. There is a single 4 cm stone in the gallbladder with a thickened and edematous wall. A tiny amount of pericholecystic fluid cannot be excluded. No Murphy's sign reported. The findings of cholelithiasis, a thickened and edematous wall, and right upper quadrant pain is concerning for acute cholecystitis. If the clinical picture remains ambiguous, a HIDA scan could further evaluate. Electronically Signed   By: Gerome Samavid  Williams III M.D   On: 05/23/2017 10:02    Anti-infectives: Anti-infectives (From admission, onward)   Start     Dose/Rate Route Frequency Ordered Stop   05/23/17 1312  ciprofloxacin (CIPRO) IVPB 400 mg     400 mg 200 mL/hr over 60 Minutes Intravenous Every 12 hours 05/23/17 1312     05/23/17 1312  metroNIDAZOLE (FLAGYL) IVPB 500 mg     500 mg 100 mL/hr over 60 Minutes Intravenous Every 8 hours 05/23/17 1312     05/23/17 1015  ciprofloxacin (CIPRO) IVPB 400 mg     400 mg 200 mL/hr over 60 Minutes Intravenous  Once 05/23/17 1014 05/23/17 1257   05/23/17 1015  metroNIDAZOLE (FLAGYL) IVPB 500 mg     500 mg 100 mL/hr over 60 Minutes Intravenous  Once 05/23/17 1014 05/23/17 1126      Assessment/Plan:  [age] year old female admitted with abdominal pain.  Although radiology interpretations are concerning for cholecystitis her history and physical were never consistent with this diagnosis.  The rapid nature of improvement after initiation of acid suppression and antibiotics makes high suspicion for peptic ulcer disease rather than that of biliary colic.  As patient is currently symptom-free, plan will be to advance her diet.  Assuming that she  tolerates advancement of diet without return of her pain and she will be transitioned to oral therapy.  At which time she could be discharged home for further outpatient workup which would include an upper endoscopy.  Should there be no evidence of peptic ulcer disease then an outpatient elective cholecystectomy would be entertained.  Encourage ambulation, incentive spirometer usage, oral intake.  Kaylenn Civil T. Tonita CongWoodham, MD, [facility] HospitalFACS General Surgeon [facility]  Day [facility] [phone]([id number]) [phone] Night [facility] [phone](7p-7a) [phone] 05/24/2017

## 2017-05-24 NOTE — Progress Notes (Signed)
Patient cleared for discharge. Instruction given on new medications and when to take. Patient verbalized that all belongings were gathered from room. IV removed and patient left hospital with staff.

## 2017-05-26 ENCOUNTER — Telehealth: Payer: Self-pay

## 2017-05-26 LAB — HIV ANTIBODY (ROUTINE TESTING W REFLEX): HIV Screen 4th Generation wRfx: NONREACTIVE

## 2017-05-26 NOTE — Telephone Encounter (Signed)
Patient called in at this time. She was admitted over the weekend to the hospital and was given pain medicines.  She stated she is constipated and wanted advise on what to take or try.   We discussed using fleets enema for immediate relief and Miralax daily until her bowels are regular again.  She was encouraged to drink plenty of water. She has a follow up appointment with Dr.Woodham 06/09/17.

## 2017-05-27 ENCOUNTER — Telehealth: Payer: Self-pay

## 2017-05-27 NOTE — Telephone Encounter (Signed)
Patient called and wanted to speak with you about her medications. She wanted for you to call her today. Cell: [phone]

## 2017-05-27 NOTE — Telephone Encounter (Signed)
Spoke with patient at this time. She stated she had used the enema and had  A very small bowel movement. She took a dose of Miralax and had a bowel movemenet and wanted to know when she should stop with the Miralax. She was insructed to continue the Miralax until she felt that she is back on her regular schedule with having bowel movements.  She stated her son is having a procedure on Friday and will need to follow up with Dr.Woodham on 06/09/17 and this was previously discussed with Dr.Woodham before her discharge from hospital.

## 2017-05-29 ENCOUNTER — Encounter: Payer: Self-pay | Admitting: General Surgery

## 2017-05-29 ENCOUNTER — Ambulatory Visit (INDEPENDENT_AMBULATORY_CARE_PROVIDER_SITE_OTHER): Payer: 59 | Admitting: General Surgery

## 2017-05-29 ENCOUNTER — Other Ambulatory Visit: Payer: Self-pay | Admitting: General Surgery

## 2017-05-29 VITALS — BP 150/93 | HR 87 | Temp 98.3°F | Ht 61.0 in | Wt 177.0 lb

## 2017-05-29 DIAGNOSIS — R1011 Right upper quadrant pain: Secondary | ICD-10-CM | POA: Diagnosis not present

## 2017-05-29 DIAGNOSIS — G8929 Other chronic pain: Secondary | ICD-10-CM

## 2017-05-29 MED ORDER — SUCRALFATE 1 G PO TABS: 1.0000 g | ORAL_TABLET | Freq: Three times a day (TID) | ORAL | 0 refills | Status: DC

## 2017-05-29 NOTE — Progress Notes (Signed)
Outpatient Surgical Follow Up  05/29/2017  Misty Welch is an 46 y.o. female.   Chief Complaint  Patient presents with  . New Patient (Initial Visit)    possible Gallstones,right upper quadrant pain, and nausea    HPI: 46 year old female returns to clinic 5 days after being discharged from the hospital with complaints of abdominal pain.  Patient provides an inconsistent history about the timing and location of her abdominal pain.  She cannot relate any of the pain to eating or being after eating.  The pain is in her mid epigastric and underneath her ribs on the right.  Occasionally she has pain that shoots through to her back.  When she was in the hospital she was found to have a large gallstone with question of cholecystitis.  Her abdominal pain in the hospital resolved very quickly after initiation of acid suppression and antibiotics.  She was discharged home on a course of oral antibiotics and acid suppression however she states that the antibiotics have been upsetting her stomach and she has been having intermittent abdominal pains all week.  The pains have not been as severe as last week but she has not been eating very much either.  She states that her appetite has been decreased secondary to the antibiotics.  She also states that her stools have turned black.  She denies any fevers, chills, vomiting but she has had nausea.  She denies chest pain, shortness of breath.  Past Medical History:  Diagnosis Date  . Cyst of ovary   . Elevated cholesterol   . Heavy menstrual bleeding     Past Surgical History:  Procedure Laterality Date  . CESAREAN SECTION  [id number],2006  . OOPHORECTOMY  2011  . TUBAL LIGATION  2011    Family History  Problem Relation Age of Onset  . Heart disease Maternal Grandmother     Social History:  reports that  has never smoked. she has never used smokeless tobacco. She reports that she does not drink alcohol or use drugs.  Allergies:  Allergies  Allergen  Reactions  . Macrobid [Nitrofurantoin Monohyd Macro] Anaphylaxis  . Nitrofurantoin Anaphylaxis  . Penicillins     Childhood reaction    Medications reviewed.    ROS Multipoint review of systems was completed, all pertinent positives and negatives are documented within the HPI and the remainder are negative   BP (!) 150/93   Pulse 87   Temp 98.3 F (36.8 C) (Oral)   Ht 5\' 1"  (1.549 m)   Wt 80.3 kg (177 lb)   BMI 33.44 kg/m   Physical Exam General: No acute distress Neck: Supple nontender Chest: Clear to auscultation Heart: Regular rate and rhythm Abdomen: Soft, minimally tender to deep palpation in the right upper quadrant but without Murphy sign, nondistended.  Normal bowel sounds. Extremities: Moves all extremities well.    No results found for this or any previous visit (from the past 48 hour(s)). No results found.  Assessment/Plan:  1. Abdominal pain, chronic, right upper quadrant 46 year old female with chronic upper abdominal pain.  Images from recent hospitalization with question of cholecystitis but history and physical continue to not be classic for biliary disease.  Had a long conversation with the patient about biliary colic versus pain from an ulcer.  Also had a long conversation about different workup options including upper endoscopy, H. pylori testing, HIDA scans.  Discussed nonpharmacologic options to test for biliary tree including eating a fatty meal such as fried chicken.  At  the conclusion of this long conversation I counseled the patient to stop taking her antibiotics as that has been upsetting her stomach with nausea.  We will provide her with Carafate as a coating agent for the possibility of ulcers.  She is going to do a fried chicken challenge and will report back to clinic should she have postprandial pain.  She understands that she would likely be my partner, Dr. Excell Seltzerooper, in that case.  We will set her up for an upper endoscopy which she states she  would likely cancel if her symptoms completely resolved with the initiation of Carafate.  We will test her for H. pylori with the understanding that she will be provided a different triple antibiotic therapy should it be positive.  This would also be accompanied with a referral to gastroenterology.  Patient stated understanding that if she has abdominal pain that does not resolve or she is unable to maintain hydration that she needs to be - H. pylori antibody, IgG; Future  A total of 50 minutes was used on this encounter with the vast majority of it used for counseling or coordination of care.  Of the 50 minutes used on this encounter, 35 minutes of the time was spent going over treatment options and discussing the ramifications of them.   Ricarda Frameharles Wrigley Winborne, MD FACS General Surgeon  05/29/2017,11:11 AM

## 2017-05-29 NOTE — Patient Instructions (Signed)
Please stop taking the antibiotics.  Continue taking the Protonix 1 tablet daily.  Start taking Carafate as directed.  We will send a referral to the Gastroenterologist so they could schedule you an EGD. They will call you with date and time.  [facility] We will call you with results.

## 2017-05-29 NOTE — Telephone Encounter (Signed)
Patient is not feeling well. She has not taken her antibiotics today due to not feeling well.   She is having right upper quadrant. She is nauseated, no vomiting.  Patient added to schedule today per Dr.Woodham.

## 2017-06-01 LAB — H. PYLORI ANTIBODY, IGG: H. pylori, IgG AbS: 8.04 Index Value — ABNORMAL HIGH (ref 0.00–0.79)

## 2017-06-03 ENCOUNTER — Telehealth: Payer: Self-pay

## 2017-06-03 MED ORDER — CLARITHROMYCIN 500 MG PO TABS: 500.0000 mg | ORAL_TABLET | Freq: Two times a day (BID) | ORAL | 0 refills | Status: DC

## 2017-06-03 MED ORDER — TINIDAZOLE 500 MG PO TABS: 500.0000 mg | ORAL_TABLET | Freq: Two times a day (BID) | ORAL | 0 refills | Status: AC

## 2017-06-03 MED ORDER — ONDANSETRON HCL 4 MG PO TABS: 4.0000 mg | ORAL_TABLET | Freq: Three times a day (TID) | ORAL | 0 refills | Status: DC | PRN

## 2017-06-03 MED ORDER — CLARITHROMYCIN 250 MG/5ML PO SUSR: 500.0000 mg | Freq: Two times a day (BID) | ORAL | 0 refills | Status: AC

## 2017-06-03 NOTE — Telephone Encounter (Signed)
Called patient to let her know that her H Pylori was Positive (High). Therefore, she is to take antibiotics. I told patient that she could go to her pharmacy and pick them up and start them as soon as possible. Patient understood and had no further questions.  Patient wants to cancel her appointment with Dr. Tonita CongWoodham on 06/10/2017 and see him when he is in days. I told her that I would have to ask him first before I could schedule it. She understood. I would call her and let her know.

## 2017-06-05 ENCOUNTER — Telehealth: Payer: Self-pay | Admitting: General Surgery

## 2017-06-05 NOTE — Telephone Encounter (Signed)
I have contacted patient to see if she had called [facility] back to make an appointment per Dr Talmage CoinWoodham's request. Patient declined the referral to GI. She states that she was put on a new antibiotic and will have labs done on 06/21/17. She also has an appointment to see Dr Tonita CongWoodham on 06/22/17. She would like to wait to see GI until she see's Dr Tonita CongWoodham again. I have canceled the referral at this time.

## 2017-06-08 ENCOUNTER — Other Ambulatory Visit: Payer: Self-pay

## 2017-06-08 DIAGNOSIS — R1011 Right upper quadrant pain: Secondary | ICD-10-CM

## 2017-06-09 ENCOUNTER — Ambulatory Visit: Payer: 59 | Admitting: General Surgery

## 2017-06-16 NOTE — Telephone Encounter (Signed)
Patient has an appointment to see Dr. Tonita CongWoodham on 06/22/2017 to go over her lab results and discuss possible surgery date.

## 2017-06-17 ENCOUNTER — Other Ambulatory Visit: Payer: Self-pay | Admitting: General Surgery

## 2017-06-18 ENCOUNTER — Other Ambulatory Visit: Payer: Self-pay | Admitting: General Surgery

## 2017-06-18 LAB — H. PYLORI ANTIBODY, IGG: H. pylori, IgG AbS: 7.55 Index Value — ABNORMAL HIGH (ref 0.00–0.79)

## 2017-06-22 ENCOUNTER — Encounter: Payer: Self-pay | Admitting: General Surgery

## 2017-06-22 ENCOUNTER — Ambulatory Visit (INDEPENDENT_AMBULATORY_CARE_PROVIDER_SITE_OTHER): Payer: 59 | Admitting: General Surgery

## 2017-06-22 VITALS — BP 141/81 | HR 103 | Temp 98.7°F | Ht 61.0 in | Wt 179.2 lb

## 2017-06-22 DIAGNOSIS — K2970 Gastritis, unspecified, without bleeding: Secondary | ICD-10-CM

## 2017-06-22 DIAGNOSIS — B9681 Helicobacter pylori [H. pylori] as the cause of diseases classified elsewhere: Secondary | ICD-10-CM | POA: Diagnosis not present

## 2017-06-22 HISTORY — DX: Helicobacter pylori (H. pylori) as the cause of diseases classified elsewhere: B96.81

## 2017-06-22 NOTE — Progress Notes (Signed)
Outpatient Surgical Follow Up  06/22/2017  Misty Welch is an 46 y.o. female.   Chief Complaint  Patient presents with  . Routine follow-up     Abdominal pain-Dr.Marikay Roads    HPI: 46 year old female returns to clinic for follow-up of her abdominal pain.  Since her last visit she underwent an H. pylori testing which returned positive and she was started on triple antibiotic therapy for this.  She reports that she has been pain-free for the last 2 weeks.  She has attempted fatty food challenge without any recurrence of her pain.  Her last bout of pain Came after eating spicy nachos with salsa. Patient denies any abdominal pain, fevers, chills, nausea, vomiting, chest pain, shortness of breath, diarrhea, constipation.  Past Medical History:  Diagnosis Date  . Cyst of ovary   . Elevated cholesterol   . Heavy menstrual bleeding     Past Surgical History:  Procedure Laterality Date  . CESAREAN SECTION  [id number],2006  . OOPHORECTOMY  2011  . TUBAL LIGATION  2011    Family History  Problem Relation Age of Onset  . Heart disease Maternal Grandmother     Social History:  reports that  has never smoked. she has never used smokeless tobacco. She reports that she does not drink alcohol or use drugs.  Allergies:  Allergies  Allergen Reactions  . Macrobid [Nitrofurantoin Monohyd Macro] Anaphylaxis  . Nitrofurantoin Anaphylaxis  . Penicillins     Childhood reaction    Medications reviewed.    ROS A multipoint of systems was completed, all pertinent positives and negatives were document within the HPI and the remainder are negative   BP (!) 141/81   Pulse (!) 103   Temp 98.7 F (37.1 C) (Oral)   Ht 5\' 1"  (1.549 m)   Wt 81.3 kg (179 lb 3.2 oz)   BMI 33.86 kg/m   Physical Exam General: No acute distress Chest: Clear to auscultation Heart: Regular rate and rhythm Abdomen: Soft, nontender, nondistended    No results found for this or any previous visit (from the past 48  hour(s)). No results found.  Assessment/Plan:  1. Helicobacter positive gastritis 46 year old female with abdominal pain and H. pylori positive antibodies.  Her abdominal pain has resolved with H. pylori treatment.  She understands that she does have a large gallstone but this does not appear to be the source of her pain.  Discussed it is okay to stop her Carafate but that she should continue the Protonix for a total of 3 months.  Discussed that if her pain comes back then we would recommend GI evaluation.  Discussed the signs and symptoms of gallbladder attacks and to return immediately should they occur.  She voiced understanding and will follow-up in clinic on an as-needed basis.  A total of 15 minutes was used on this encounter with greater than 50% of it used for counseling or coordination of care.   Ricarda Frameharles Tasean Mancha, MD FACS General Surgeon  06/22/2017,5:04 PM

## 2017-06-22 NOTE — Patient Instructions (Signed)
Please call our office if you have questions or concerns.   

## 2017-07-23 ENCOUNTER — Encounter: Payer: 59 | Admitting: Obstetrics and Gynecology

## 2017-07-31 ENCOUNTER — Encounter: Payer: 59 | Admitting: Obstetrics and Gynecology

## 2017-08-17 ENCOUNTER — Telehealth: Payer: Self-pay

## 2017-08-17 ENCOUNTER — Telehealth: Payer: Self-pay | Admitting: *Deleted

## 2017-08-17 DIAGNOSIS — Z1239 Encounter for other screening for malignant neoplasm of breast: Secondary | ICD-10-CM

## 2017-08-17 NOTE — Telephone Encounter (Signed)
Patient called and states that she needs a mammogram done . Patient needs a order place . Patient has an appt on 08/19/17 and would like for her mammogram appt to be that same day. [facility] that the patient can be seen on 08/19/17 . We would have to call [facility] to schedule the appt for the patient since [place] only has one appt slot available that day.  Patient states you can call her [phone]. Please advise. Thank you

## 2017-08-17 NOTE — Telephone Encounter (Signed)
Mammogram scheduled for 08/19/17 at 12:40 in [place]. Pt informed. Order in.

## 2017-08-19 ENCOUNTER — Ambulatory Visit
Admission: RE | Admit: 2017-08-19 | Discharge: 2017-08-19 | Disposition: A | Discharge status: Home / Self Care | Payer: 59 | Source: Ambulatory Visit | Attending: Certified Nurse Midwife | Admitting: Certified Nurse Midwife

## 2017-08-19 ENCOUNTER — Ambulatory Visit (INDEPENDENT_AMBULATORY_CARE_PROVIDER_SITE_OTHER): Payer: 59 | Admitting: Certified Nurse Midwife

## 2017-08-19 ENCOUNTER — Telehealth: Payer: Self-pay | Admitting: Certified Nurse Midwife

## 2017-08-19 ENCOUNTER — Encounter: Payer: Self-pay | Admitting: Certified Nurse Midwife

## 2017-08-19 VITALS — BP 127/76 | HR 94 | Ht 61.0 in | Wt 180.4 lb

## 2017-08-19 DIAGNOSIS — Z862 Personal history of diseases of the blood and blood-forming organs and certain disorders involving the immune mechanism: Secondary | ICD-10-CM

## 2017-08-19 DIAGNOSIS — Z1231 Encounter for screening mammogram for malignant neoplasm of breast: Secondary | ICD-10-CM | POA: Insufficient documentation

## 2017-08-19 DIAGNOSIS — Z1239 Encounter for other screening for malignant neoplasm of breast: Secondary | ICD-10-CM

## 2017-08-19 MED ORDER — NORETHIN-ETH ESTRAD-FE BIPHAS 1 MG-10 MCG / 10 MCG PO TABS: 1.0000 | ORAL_TABLET | Freq: Every day | ORAL | 6 refills | Status: DC

## 2017-08-19 NOTE — Progress Notes (Signed)
Pt is here for an annual exam. Has a mammogram scheduled for today.

## 2017-08-19 NOTE — Telephone Encounter (Signed)
Pt notified of mammogram results.   Misty BurkeAnnie Cyndel Welch, CNM

## 2017-08-19 NOTE — Patient Instructions (Signed)
Preventive Care 18-39 Years, Female Preventive care refers to lifestyle choices and visits with your health care provider that can promote health and wellness. What does preventive care include?  A yearly physical exam. This is also called an annual well check.  Dental exams once or twice a year.  Routine eye exams. Ask your health care provider how often you should have your eyes checked.  Personal lifestyle choices, including: ? Daily care of your teeth and gums. ? Regular physical activity. ? Eating a healthy diet. ? Avoiding tobacco and drug use. ? Limiting alcohol use. ? Practicing safe sex. ? Taking vitamin and mineral supplements as recommended by your health care provider. What happens during an annual well check? The services and screenings done by your health care provider during your annual well check will depend on your age, overall health, lifestyle risk factors, and family history of disease. Counseling Your health care provider may ask you questions about your:  Alcohol use.  Tobacco use.  Drug use.  Emotional well-being.  Home and relationship well-being.  Sexual activity.  Eating habits.  Work and work Statistician.  Method of birth control.  Menstrual cycle.  Pregnancy history.  Screening You may have the following tests or measurements:  Height, weight, and BMI.  Diabetes screening. This is done by checking your blood sugar (glucose) after you have not eaten for a while (fasting).  Blood pressure.  Lipid and cholesterol levels. These may be checked every 5 years starting at age 66.  Skin check.  Hepatitis C blood test.  Hepatitis B blood test.  Sexually transmitted disease (STD) testing.  BRCA-related cancer screening. This may be done if you have a family history of breast, ovarian, tubal, or peritoneal cancers.  Pelvic exam and Pap test. This may be done every 3 years starting at age 40. Starting at age 59, this may be done every 5  years if you have a Pap test in combination with an HPV test.  Discuss your test results, treatment options, and if necessary, the need for more tests with your health care provider. Vaccines Your health care provider may recommend certain vaccines, such as:  Influenza vaccine. This is recommended every year.  Tetanus, diphtheria, and acellular pertussis (Tdap, Td) vaccine. You may need a Td booster every 10 years.  Varicella vaccine. You may need this if you have not been vaccinated.  HPV vaccine. If you are 69 or younger, you may need three doses over 6 months.  Measles, mumps, and rubella (MMR) vaccine. You may need at least one dose of MMR. You may also need a second dose.  Pneumococcal 13-valent conjugate (PCV13) vaccine. You may need this if you have certain conditions and were not previously vaccinated.  Pneumococcal polysaccharide (PPSV23) vaccine. You may need one or two doses if you smoke cigarettes or if you have certain conditions.  Meningococcal vaccine. One dose is recommended if you are age 27-21 years and a first-year college student living in a residence hall, or if you have one of several medical conditions. You may also need additional booster doses.  Hepatitis A vaccine. You may need this if you have certain conditions or if you travel or work in places where you may be exposed to hepatitis A.  Hepatitis B vaccine. You may need this if you have certain conditions or if you travel or work in places where you may be exposed to hepatitis B.  Haemophilus influenzae type b (Hib) vaccine. You may need this if  you have certain risk factors.  Talk to your health care provider about which screenings and vaccines you need and how often you need them. This information is not intended to replace advice given to you by your health care provider. Make sure you discuss any questions you have with your health care provider. Document Released: 09/02/2001 Document Revised: 03/26/2016  Document Reviewed: 05/08/2015 Elsevier Interactive Patient Education  [employer].

## 2017-08-19 NOTE — Progress Notes (Signed)
GYNECOLOGY ANNUAL PREVENTATIVE CARE ENCOUNTER NOTE  Subjective:   Misty Welch is a 47 y.o. G2P0 female here for a routine annual gynecologic exam.  Current complaints: none.   Denies abnormal vaginal bleeding, discharge, pelvic pain, problems with intercourse or other gynecologic concerns. She declines flu vaccine.    Gynecologic History No LMP recorded. Patient is not currently having periods (Reason: Oral contraceptives). Contraception: OCP (estrogen/progesterone) Last Pap: 03/2015. Results were: normal. Due 2021 Last mammogram: 07/2016. Results were: normal  Obstetric History OB History  Gravida Para Term Preterm AB Living  2            SAB TAB Ectopic Multiple Live Births               # Outcome Date GA Lbr Len/2nd Weight Sex Delivery Anes PTL Lv  2 Gravida 2006    F CS-Unspec     1 Gravida 2003    M CS-Unspec         Past Medical History:  Diagnosis Date  . Cyst of ovary   . Elevated cholesterol   . Heavy menstrual bleeding   . Peptic ulcer     Past Surgical History:  Procedure Laterality Date  . CESAREAN SECTION  [id number]  . OOPHORECTOMY  2011  . TUBAL LIGATION  2011    Current Outpatient Medications on File Prior to Visit  Medication Sig Dispense Refill  . Aspirin-Acetaminophen-Caffeine 500-325-65 MG PACK Take 1 Package by mouth as needed.    . LO LOESTRIN FE 1 MG-10 MCG / 10 MCG tablet TAKE 1 TABLET BY MOUTH DAILY. 28 tablet 6  . pantoprazole (PROTONIX) 40 MG tablet      No current facility-administered medications on file prior to visit.     Allergies  Allergen Reactions  . Macrobid [Nitrofurantoin Monohyd Macro] Anaphylaxis  . Nitrofurantoin Anaphylaxis  . Penicillins     Childhood reaction    Social History   Socioeconomic History  . Marital status: Married    Spouse name: Not on file  . Number of children: Not on file  . Years of education: Not on file  . Highest education level: Not on file  Social Needs  . Financial resource strain:  Not on file  . Food insecurity - worry: Not on file  . Food insecurity - inability: Not on file  . Transportation needs - medical: Not on file  . Transportation needs - non-medical: Not on file  Occupational History  . Not on file  Tobacco Use  . Smoking status: Never Smoker  . Smokeless tobacco: Never Used  Substance and Sexual Activity  . Alcohol use: No  . Drug use: No  . Sexual activity: Yes    Birth control/protection: Surgical  Other Topics Concern  . Not on file  Social History Narrative  . Not on file    Family History  Problem Relation Age of Onset  . Heart disease Maternal Grandmother     The following portions of the patient's history were reviewed and updated as appropriate: allergies, current medications, past family history, past medical history, past social history, past surgical history and problem list.  Review of Systems Constitutional: negative Eyes: negative Ears, nose, mouth, throat, and face: negative Respiratory: negative Cardiovascular: negative Gastrointestinal: negative Genitourinary:negative Integument/breast: negative, pt concerned that she felt lump bilaterally near axilla and under breast. States her  neighbor has cancer and she is anxious. has mammogram scheduled for today  Hematologic/lymphatic: negative Musculoskeletal:negative Neurological: negative Behavioral/Psych: negative Endocrine: negative  Allergic/Immunologic: negative   Objective:  BP 127/76   Pulse 94   Ht 5\' 1"  (1.549 m)   Wt 180 lb 7 oz (81.8 kg)   BMI 34.09 kg/m  CONSTITUTIONAL: Well-developed, well-nourished, overweight female in no acute distress.  HENT:  Normocephalic, atraumatic, External right and left ear normal. Oropharynx is clear and moist EYES: Conjunctivae and EOM are normal. Pupils are equal, round, and reactive to light. No scleral icterus.  NECK: Normal range of motion, supple, no masses.  Normal thyroid.  SKIN: Skin is warm and dry. No rash noted. Not  diaphoretic. No erythema. No pallor. NEUROLOGIC: Alert and oriented to person, place, and time. Normal reflexes, muscle tone coordination. No cranial nerve deficit noted. PSYCHIATRIC: Normal mood and affect. Normal behavior. Normal judgment and thought content. CARDIOVASCULAR: Normal heart rate noted, regular rhythm RESPIRATORY: Clear to auscultation bilaterally. Effort and breath sounds normal, no problems with respiration noted. BREASTS: Symmetric in size. No masses, skin changes, nipple drainage, or lymphadenopathy. ABDOMEN: Soft, normal bowel sounds, no distention noted.  No tenderness, rebound or guarding. Scar under right breast and lower abdomen. Surgery as infant and c-section x 2 PELVIC: Normal appearing external genitalia; normal appearing vaginal mucosa and cervix.  No abnormal discharge noted.  Pap smear obtained.  Normal uterine size, no other palpable masses, no uterine or adnexal tenderness. MUSCULOSKELETAL: Normal range of motion. No tenderness.  No cyanosis, clubbing, or edema.  2+ distal pulses.   Assessment and Plan:  There are no diagnoses linked to this encounter. Mammogram ordered Refill for birth control  Routine preventative health maintenance measures emphasized. Please refer to After Visit Summary for other counseling recommendations.    Doreene Burke, CNM

## 2017-08-20 LAB — CBC WITH DIFFERENTIAL/PLATELET
Basophils Absolute: 0.1 10*3/uL (ref 0.0–0.2)
Basos: 1 %
EOS (ABSOLUTE): 0.3 10*3/uL (ref 0.0–0.4)
EOS: 5 %
HEMATOCRIT: 39.6 % (ref 34.0–46.6)
Hemoglobin: 13.1 g/dL (ref 11.1–15.9)
IMMATURE GRANS (ABS): 0 10*3/uL (ref 0.0–0.1)
IMMATURE GRANULOCYTES: 0 %
LYMPHS: 24 %
Lymphocytes Absolute: 1.6 10*3/uL (ref 0.7–3.1)
MCH: 29 pg (ref 26.6–33.0)
MCHC: 33.1 g/dL (ref 31.5–35.7)
MCV: 88 fL (ref 79–97)
MONOCYTES: 8 %
MONOS ABS: 0.6 10*3/uL (ref 0.1–0.9)
NEUTROS PCT: 62 %
Neutrophils Absolute: 4.1 10*3/uL (ref 1.4–7.0)
Platelets: 314 10*3/uL (ref 150–379)
RBC: 4.51 x10E6/uL (ref 3.77–5.28)
RDW: 12.4 % (ref 12.3–15.4)
WBC: 6.7 10*3/uL (ref 3.4–10.8)

## 2017-08-20 LAB — FERRITIN: Ferritin: 79 ng/mL (ref 15–150)

## 2017-08-21 ENCOUNTER — Encounter: Payer: Self-pay | Admitting: Certified Nurse Midwife

## 2017-09-15 ENCOUNTER — Encounter: Payer: 59 | Admitting: Obstetrics and Gynecology

## 2017-10-23 ENCOUNTER — Other Ambulatory Visit: Payer: Self-pay | Admitting: General Surgery

## 2017-10-23 NOTE — Telephone Encounter (Signed)
Patient called and is asking for a refill on one of her medications please call patient and advise.

## 2017-10-23 NOTE — Telephone Encounter (Signed)
Patient is asking for a refill on her Protonix. She needs a 3 month supply for insurance to cover the cost per pharmacist at [facility] . I will check with doctor on Monday for permission to do so. Patient will call back on Monday.

## 2017-10-26 MED ORDER — PANTOPRAZOLE SODIUM 40 MG PO TBEC: 40.0000 mg | DELAYED_RELEASE_TABLET | Freq: Every day | ORAL | 0 refills | Status: DC

## 2017-11-17 IMAGING — US US ABDOMEN LIMITED
1 series · 14 of 25 positions shown · non-contrast
Comparison: None.

CLINICAL DATA: Right upper quadrant pain.

EXAM:
ULTRASOUND ABDOMEN LIMITED RIGHT UPPER QUADRANT

[Series 1: us abdomen limited · 0.22mm/px · 14 of 51 slices shown]
[im 1/51]
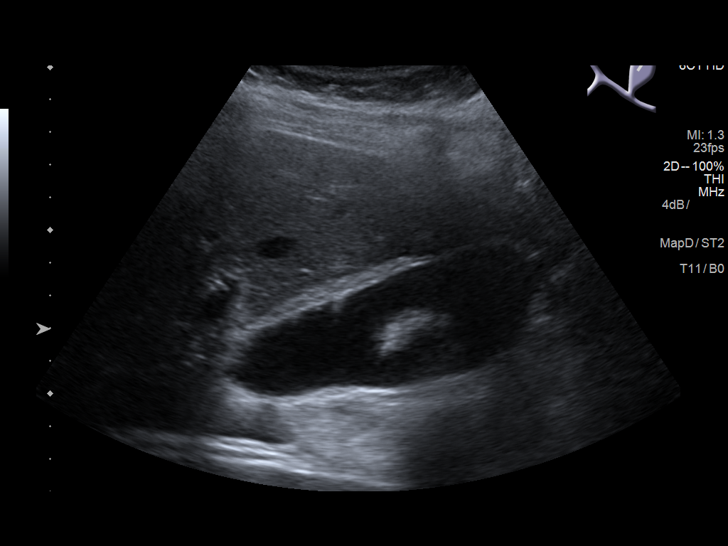
[im 5/51]
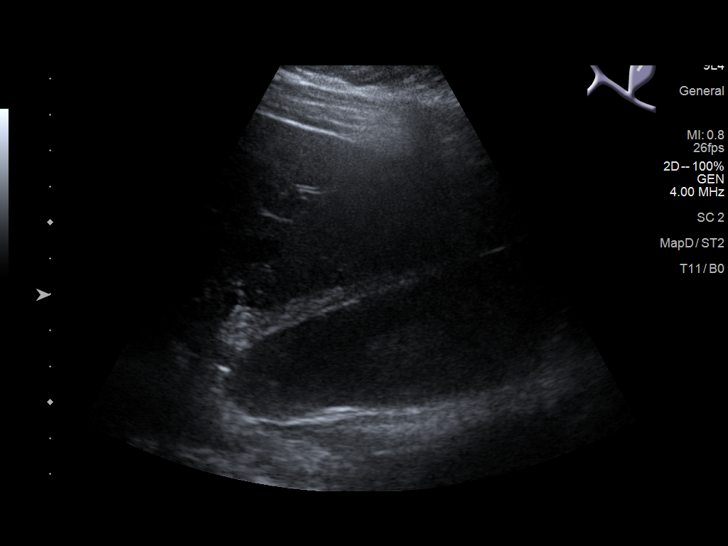
[im 9/51]
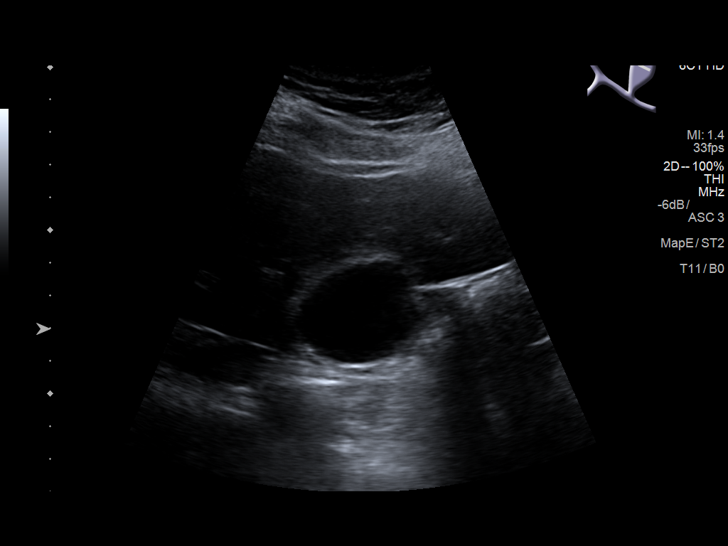
[im 13/51]
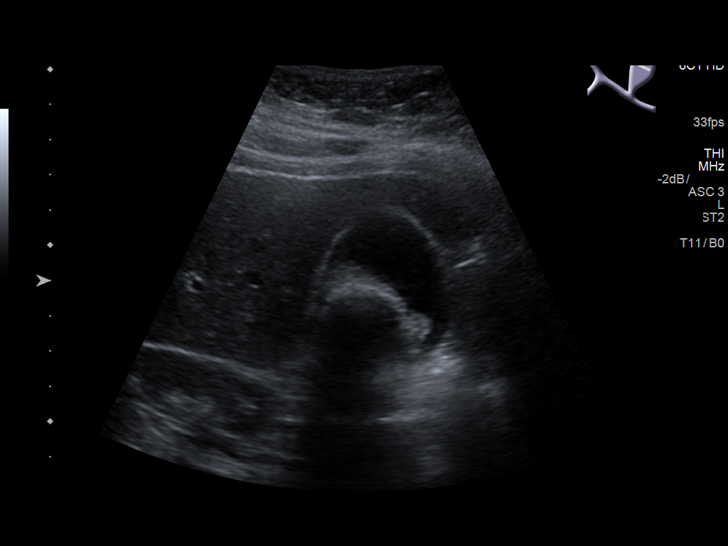
[im 17/51]
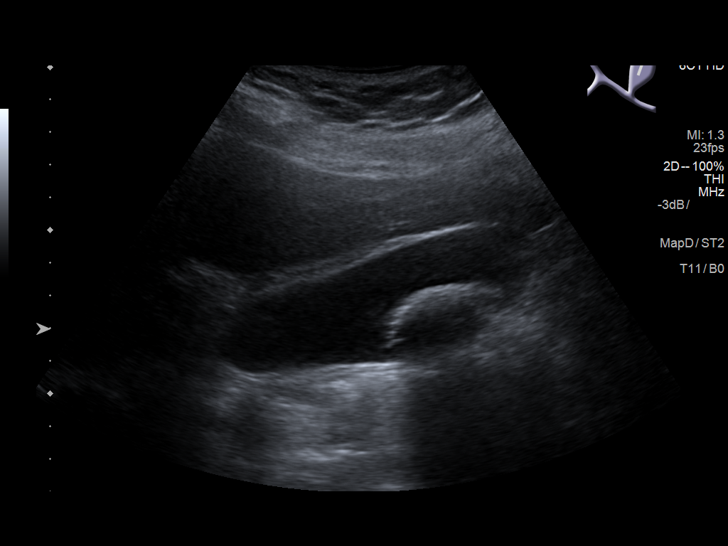
[im 19/51]
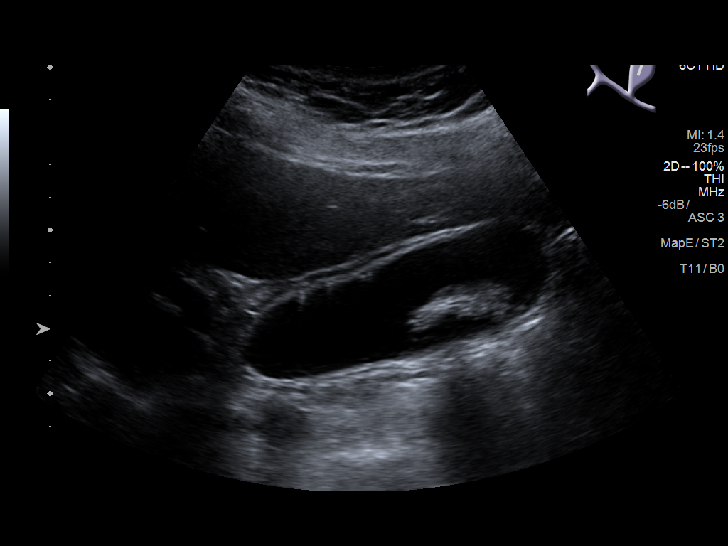
[im 23/51]
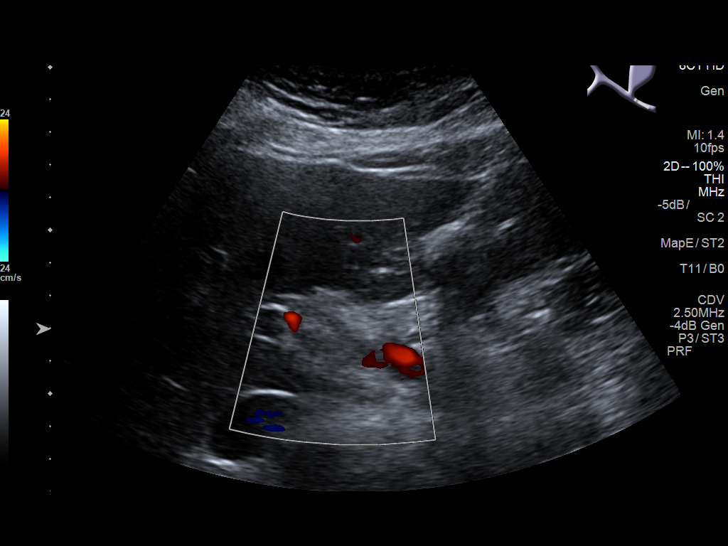
[im 28/51]
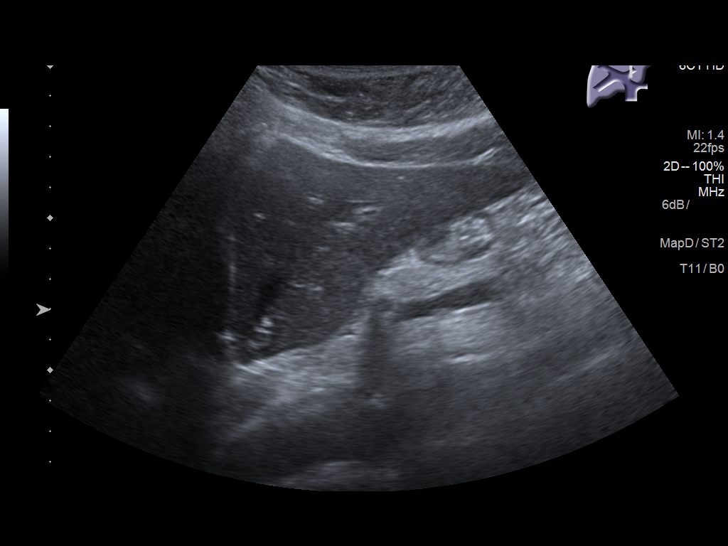
[im 32/51]
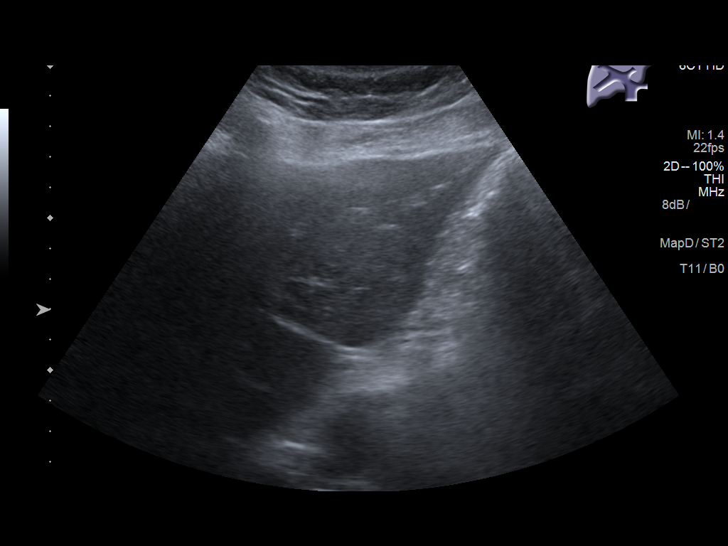
[im 34/51]
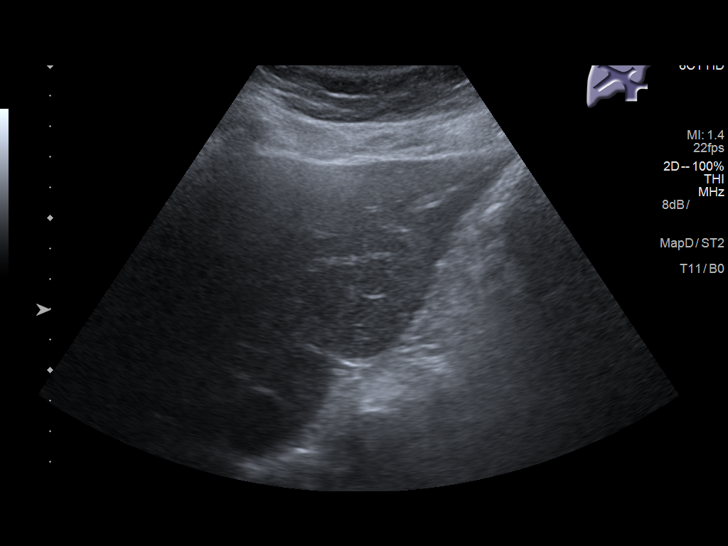
[im 38/51]
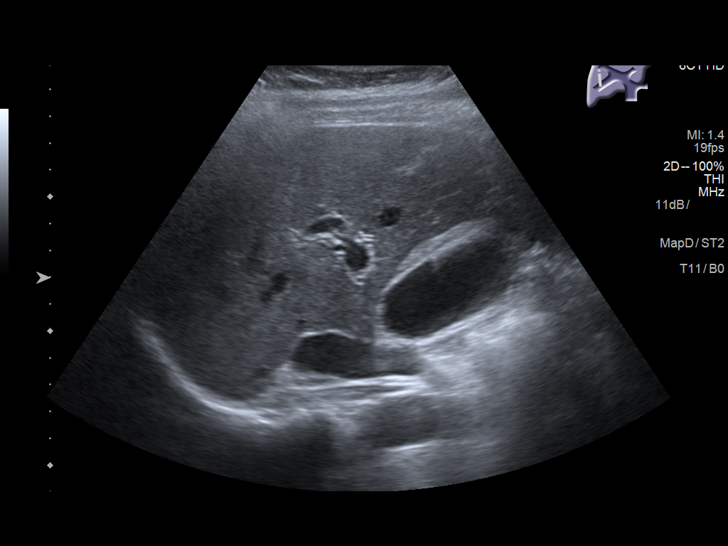
[im 42/51]
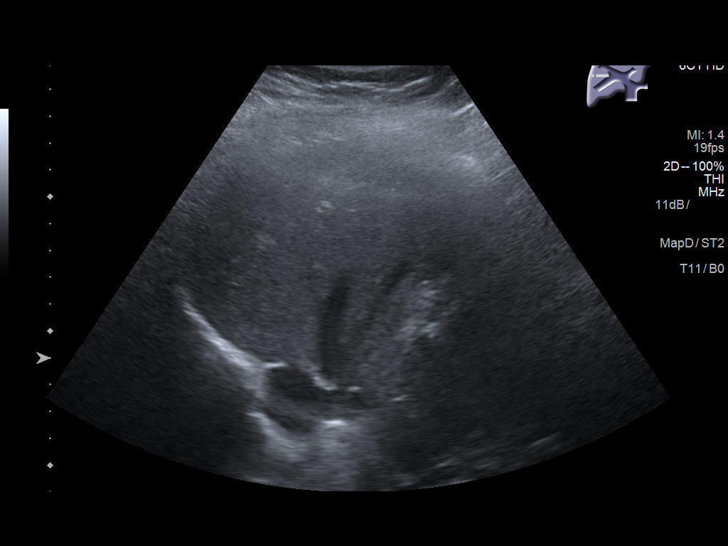
[im 46/51]
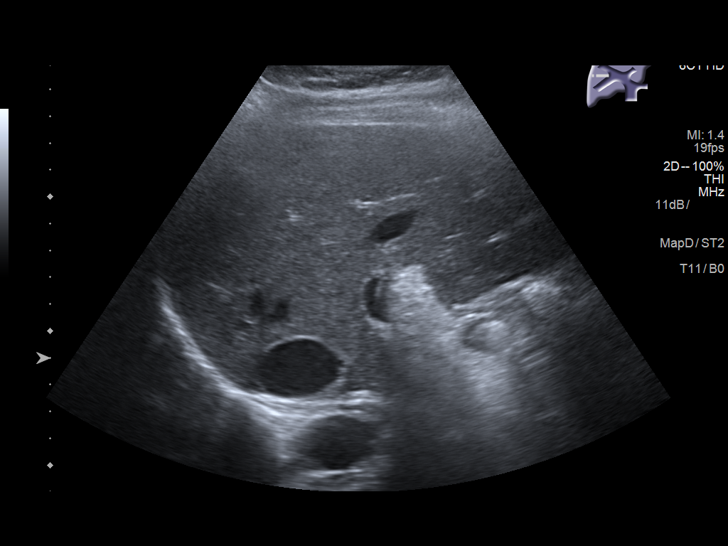
[im 51/51]
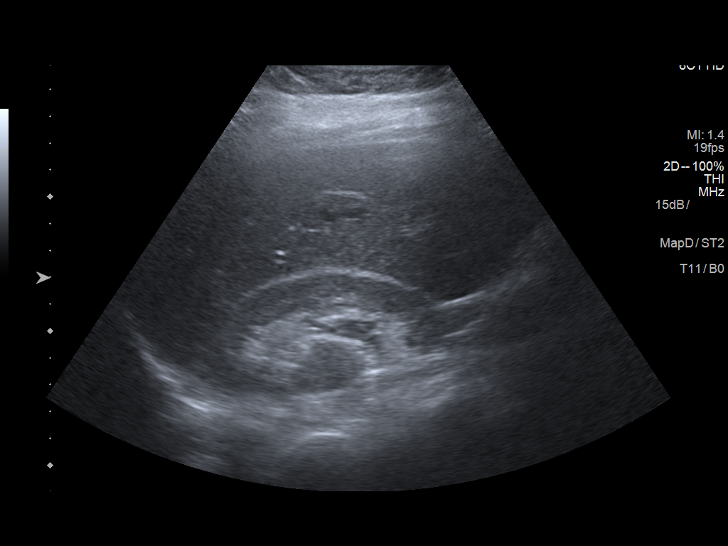

[14 of 25 positions shown; findings below may reference images not displayed]

FINDINGS: Gallbladder:

There is a single 4 cm stone in the gallbladder. The gallbladder
wall measures 6.4 mm which is thickened. The wall appears edematous.
A tiny amount of pericholecystic fluid is not excluded. No Murphy's
sign reported.

Common bile duct:

Diameter: 4 mm

Liver:

No focal lesion identified. Within normal limits in parenchymal
echogenicity. Portal vein is patent on color Doppler imaging with
normal direction of blood flow towards the liver.
IMPRESSION: 1. There is a single 4 cm stone in the gallbladder with a thickened
and edematous wall. A tiny amount of pericholecystic fluid cannot be
excluded. No Murphy's sign reported. The findings of cholelithiasis,
a thickened and edematous wall, and right upper quadrant pain is
concerning for acute cholecystitis. If the clinical picture remains
ambiguous, a HIDA scan could further evaluate.

## 2018-01-27 ENCOUNTER — Other Ambulatory Visit: Payer: Self-pay | Admitting: *Deleted

## 2018-01-27 ENCOUNTER — Encounter: Payer: Self-pay | Admitting: Obstetrics and Gynecology

## 2018-01-27 MED ORDER — PANTOPRAZOLE SODIUM 40 MG PO TBEC: 40.0000 mg | DELAYED_RELEASE_TABLET | Freq: Every day | ORAL | 3 refills | Status: DC

## 2018-03-08 ENCOUNTER — Other Ambulatory Visit: Payer: Self-pay | Admitting: Certified Nurse Midwife

## 2018-03-10 ENCOUNTER — Other Ambulatory Visit: Payer: Self-pay | Admitting: *Deleted

## 2018-03-10 MED ORDER — NORETHIN-ETH ESTRAD-FE BIPHAS 1 MG-10 MCG / 10 MCG PO TABS: 1.0000 | ORAL_TABLET | Freq: Every day | ORAL | 6 refills | Status: DC

## 2018-04-24 ENCOUNTER — Ambulatory Visit
Admission: EM | Admit: 2018-04-24 | Discharge: 2018-04-24 | Disposition: A | Discharge status: Home / Self Care | Payer: 59 | Attending: Family Medicine | Admitting: Family Medicine

## 2018-04-24 ENCOUNTER — Other Ambulatory Visit: Payer: Self-pay

## 2018-04-24 DIAGNOSIS — R062 Wheezing: Secondary | ICD-10-CM | POA: Diagnosis not present

## 2018-04-24 DIAGNOSIS — R05 Cough: Secondary | ICD-10-CM

## 2018-04-24 DIAGNOSIS — R059 Cough, unspecified: Secondary | ICD-10-CM

## 2018-04-24 MED ORDER — ALBUTEROL SULFATE HFA 108 (90 BASE) MCG/ACT IN AERS: 1.0000 | INHALATION_SPRAY | Freq: Four times a day (QID) | RESPIRATORY_TRACT | 0 refills | Status: DC | PRN

## 2018-04-24 MED ORDER — AZITHROMYCIN 250 MG PO TABS: ORAL_TABLET | ORAL | 0 refills | Status: DC

## 2018-04-24 NOTE — ED Triage Notes (Signed)
Pt with cough x one month. Was dry for first few weeks but now is coughing up green phlegm. States she notices wheezing at night. No pain

## 2018-04-24 NOTE — ED Provider Notes (Signed)
[facility]    CSN: [id number] Arrival date & time: 04/24/18  1220     History   Chief Complaint Chief Complaint  Patient presents with  . Cough    HPI Misty Welch is a 47 y.o. female.   The history is provided by the patient.  Cough  Cough characteristics:  Productive Associated symptoms: wheezing   URI  Presenting symptoms: congestion and cough   Severity:  Moderate Onset quality:  Sudden Duration:  1 month Timing:  Constant Progression:  Worsening Chronicity:  New Relieved by:  Nothing Ineffective treatments:  OTC medications Associated symptoms: sinus pain and wheezing   Risk factors: sick contacts   Risk factors: not elderly, no chronic cardiac disease, no chronic kidney disease, no chronic respiratory disease, no diabetes mellitus, no immunosuppression, no recent illness and no recent travel     Past Medical History:  Diagnosis Date  . Cyst of ovary   . Elevated cholesterol   . Heavy menstrual bleeding   . Peptic ulcer     Patient Active Problem List   Diagnosis Date Noted  . Helicobacter positive gastritis 06/22/2017  . Abdominal pain 05/23/2017  . Vitamin D deficiency 07/22/2016    Past Surgical History:  Procedure Laterality Date  . CESAREAN SECTION  [id number]  . OOPHORECTOMY  2011  . TUBAL LIGATION  2011    OB History    Gravida  2   Para      Term      Preterm      AB      Living        SAB      TAB      Ectopic      Multiple      Live Births               Home Medications    Prior to Admission medications   Medication Sig Start Date End Date Taking? Authorizing Provider  albuterol (PROVENTIL HFA;VENTOLIN HFA) 108 (90 Base) MCG/ACT inhaler Inhale 1-2 puffs into the lungs every 6 (six) hours as needed for wheezing or shortness of breath. 04/24/18   Payton Mccallum, MD  Aspirin-Acetaminophen-Caffeine [phone] MG PACK Take 1 Package by mouth as needed.    [provider]  azithromycin  (ZITHROMAX Z-PAK) 250 MG tablet 2 tabs po once day 1, then 1 tab po qd for next 4 days 04/24/18   Payton Mccallum, MD  Norethindrone-Ethinyl Estradiol-Fe Biphas (LO LOESTRIN FE) 1 MG-10 MCG / 10 MCG tablet Take 1 tablet by mouth daily. 03/10/18   Shambley, [name] N, CNM  pantoprazole (PROTONIX) 40 MG tablet Take 1 tablet by mouth 1 day or 1 dose. Please tell patient that her PCP will be the one to refill her prescriptions after this. Thank you. 06/20/17   [provider]  pantoprazole (PROTONIX) 40 MG tablet Take 1 tablet (40 mg total) by mouth daily. 01/27/18   Purcell Nails, CNM    Family History Family History  Problem Relation Age of Onset  . Heart disease Maternal Grandmother   . Breast cancer Neg Hx     Social History Social History   Tobacco Use  . Smoking status: Never Smoker  . Smokeless tobacco: Never Used  Substance Use Topics  . Alcohol use: No  . Drug use: No     Allergies   Macrobid [nitrofurantoin monohyd macro]; Nitrofurantoin; and Penicillins   Review of Systems Review of Systems  HENT: Positive for congestion  and sinus pain.   Respiratory: Positive for cough and wheezing.      Physical Exam Triage Vital Signs ED Triage Vitals  Enc Vitals Group     BP 04/24/18 1227 (!) 136/91     Pulse Rate 04/24/18 1227 86     Resp 04/24/18 1227 18     Temp 04/24/18 1227 99.1 F (37.3 C)     Temp Source 04/24/18 1227 Oral     SpO2 04/24/18 1227 100 %     Weight 04/24/18 1229 185 lb (83.9 kg)     Height 04/24/18 1229 5\' 1"  (1.549 m)     Head Circumference --      Peak Flow --      Pain Score 04/24/18 1228 0     Pain Loc --      Pain Edu? --      Excl. in GC? --    No data found.  Updated Vital Signs BP (!) 136/91 (BP Location: Left Arm)   Pulse 86   Temp 99.1 F (37.3 C) (Oral)   Resp 18   Ht 5\' 1"  (1.549 m)   Wt 83.9 kg   SpO2 100%   BMI 34.96 kg/m   Visual Acuity Right Eye Distance:   Left Eye Distance:   Bilateral Distance:     Right Eye Near:   Left Eye Near:    Bilateral Near:     Physical Exam  Constitutional: She appears well-developed and well-nourished. No distress.  HENT:  Head: Normocephalic and atraumatic.  Right Ear: Tympanic membrane, external ear and ear canal normal.  Left Ear: Tympanic membrane, external ear and ear canal normal.  Nose: No mucosal edema, rhinorrhea, nose lacerations, sinus tenderness, nasal deformity, septal deviation or nasal septal hematoma. No epistaxis.  No foreign bodies.  Mouth/Throat: Uvula is midline, oropharynx is clear and moist and mucous membranes are normal. No oropharyngeal exudate.  Eyes: Conjunctivae are normal. Right eye exhibits no discharge. Left eye exhibits no discharge. No scleral icterus.  Neck: Normal range of motion. Neck supple. No thyromegaly present.  Cardiovascular: Normal rate, regular rhythm and normal heart sounds.  Pulmonary/Chest: Effort normal. No stridor. No respiratory distress. She has wheezes (diffuse and diffuse rhonchi). She has no rales.  Lymphadenopathy:    She has no cervical adenopathy.  Skin: She is not diaphoretic.  Nursing note and vitals reviewed.    UC Treatments / Results  Labs (all labs ordered are listed, but only abnormal results are displayed) Labs Reviewed - No data to display  EKG None  Radiology No results found.  Procedures Procedures (including critical care time)  Medications Ordered in UC Medications - No data to display  Initial Impression / Assessment and Plan / UC Course  I have reviewed the triage vital signs and the nursing notes.  Pertinent labs & imaging results that were available during my care of the patient were reviewed by me and considered in my medical decision making (see chart for details).     Final Clinical Impressions(s) / UC Diagnoses   Final diagnoses:  Cough  Wheezing    ED Prescriptions    Medication Sig Dispense Auth. Provider   azithromycin (ZITHROMAX Z-PAK) 250 MG  tablet 2 tabs po once day 1, then 1 tab po qd for next 4 days 6 each Payton Mccallum, MD   albuterol (PROVENTIL HFA;VENTOLIN HFA) 108 (90 Base) MCG/ACT inhaler Inhale 1-2 puffs into the lungs every 6 (six) hours as needed for wheezing or shortness  of breath. 1 Inhaler Rolin Schult, Pamala Hurry, MD     1. diagnosis reviewed with patient  2. rx as per orders above; reviewed possible side effects, interactions, risks and benefits  3. Recommend supportive treatment with rest, fluids  4. Follow-up prn if symptoms worsen or don't improve   Controlled Substance Prescriptions [place] Controlled Substance Registry consulted? Not Applicable   Payton Mccallum, MD 04/24/18 1331

## 2018-04-28 ENCOUNTER — Ambulatory Visit (INDEPENDENT_AMBULATORY_CARE_PROVIDER_SITE_OTHER): Payer: 59

## 2018-04-28 ENCOUNTER — Ambulatory Visit
Admission: EM | Admit: 2018-04-28 | Discharge: 2018-04-28 | Disposition: A | Discharge status: Home / Self Care | Payer: 59 | Attending: Family Medicine | Admitting: Family Medicine

## 2018-04-28 ENCOUNTER — Other Ambulatory Visit: Payer: Self-pay

## 2018-04-28 ENCOUNTER — Encounter: Payer: Self-pay | Admitting: Emergency Medicine

## 2018-04-28 DIAGNOSIS — R0602 Shortness of breath: Secondary | ICD-10-CM | POA: Diagnosis not present

## 2018-04-28 DIAGNOSIS — R062 Wheezing: Secondary | ICD-10-CM

## 2018-04-28 DIAGNOSIS — J40 Bronchitis, not specified as acute or chronic: Secondary | ICD-10-CM

## 2018-04-28 DIAGNOSIS — R05 Cough: Secondary | ICD-10-CM

## 2018-04-28 DIAGNOSIS — R059 Cough, unspecified: Secondary | ICD-10-CM

## 2018-04-28 MED ORDER — PREDNISONE 20 MG PO TABS: ORAL_TABLET | ORAL | 0 refills | Status: DC

## 2018-04-28 NOTE — ED Provider Notes (Signed)
[facility]    CSN: [id number] Arrival date & time: 04/28/18  1121     History   Chief Complaint Chief Complaint  Patient presents with  . Cough    APPT    HPI Misty Welch is a 47 y.o. female.   47 yo female seen 5 days ago with bronchitis presents with c/o continuing cough. States she finished her z-pak today. Denies any fevers, chills. Still having intermittently productive cough.   The history is provided by the patient.  Cough    Past Medical History:  Diagnosis Date  . Cyst of ovary   . Elevated cholesterol   . Heavy menstrual bleeding   . Peptic ulcer     Patient Active Problem List   Diagnosis Date Noted  . Helicobacter positive gastritis 06/22/2017  . Abdominal pain 05/23/2017  . Vitamin D deficiency 07/22/2016    Past Surgical History:  Procedure Laterality Date  . CESAREAN SECTION  [id number]  . OOPHORECTOMY Right 2011  . TUBAL LIGATION  2011    OB History    Gravida  2   Para      Term      Preterm      AB      Living        SAB      TAB      Ectopic      Multiple      Live Births               Home Medications    Prior to Admission medications   Medication Sig Start Date End Date Taking? Authorizing Provider  albuterol (PROVENTIL HFA;VENTOLIN HFA) 108 (90 Base) MCG/ACT inhaler Inhale 1-2 puffs into the lungs every 6 (six) hours as needed for wheezing or shortness of breath. 04/24/18  Yes Payton Mccallum, MD  azithromycin (ZITHROMAX Z-PAK) 250 MG tablet 2 tabs po once day 1, then 1 tab po qd for next 4 days 04/24/18  Yes Carolann Brazell, Pamala Hurry, MD  Norethindrone-Ethinyl Estradiol-Fe Biphas (LO LOESTRIN FE) 1 MG-10 MCG / 10 MCG tablet Take 1 tablet by mouth daily. 03/10/18  Yes Shambley, [name] N, CNM  pantoprazole (PROTONIX) 40 MG tablet Take 1 tablet (40 mg total) by mouth daily. 01/27/18  Yes Shambley, [name] N, CNM  Aspirin-Acetaminophen-Caffeine [phone] MG PACK Take 1 Package by mouth as needed.    [provider]  pantoprazole (PROTONIX) 40 MG tablet Take 1 tablet by mouth 1 day or 1 dose. Please tell patient that her PCP will be the one to refill her prescriptions after this. Thank you. 06/20/17   [provider]  predniSONE (DELTASONE) 20 MG tablet 3 tabs po qd x 2 days, then 2 tabs po qd x 2 days, then 1 tab po qd x 2 days, then half a tab po qd x 2 days 04/28/18   Payton Mccallum, MD    Family History Family History  Problem Relation Age of Onset  . Heart disease Maternal Grandmother   . Alzheimer's disease Mother   . Cirrhosis Father        non-alcoholic  . Breast cancer Neg Hx     Social History Social History   Tobacco Use  . Smoking status: Never Smoker  . Smokeless tobacco: Never Used  Substance Use Topics  . Alcohol use: No  . Drug use: No     Allergies   Macrobid [nitrofurantoin monohyd macro]; Nitrofurantoin; and Penicillins   Review of Systems Review of  Systems  Respiratory: Positive for cough.      Physical Exam Triage Vital Signs ED Triage Vitals [04/28/18 1128]  Enc Vitals Group     BP (!) 157/94     Pulse Rate 82     Resp 16     Temp 98.9 F (37.2 C)     Temp src      SpO2 100 %     Weight 180 lb (81.6 kg)     Height 5\' 1"  (1.549 m)     Head Circumference      Peak Flow      Pain Score 0     Pain Loc      Pain Edu?      Excl. in GC?    No data found.  Updated Vital Signs BP (!) 157/94 (BP Location: Left Arm)   Pulse 82   Temp 98.9 F (37.2 C)   Resp 16   Ht 5\' 1"  (1.549 m)   Wt 81.6 kg   SpO2 100%   BMI 34.01 kg/m   Visual Acuity Right Eye Distance:   Left Eye Distance:   Bilateral Distance:    Right Eye Near:   Left Eye Near:    Bilateral Near:     Physical Exam  Constitutional: She appears well-developed and well-nourished. No distress.  HENT:  Head: Normocephalic and atraumatic.  Nose: No rhinorrhea, nose lacerations, sinus tenderness, nasal deformity, septal deviation or nasal septal hematoma. No  epistaxis.  No foreign bodies.  Mouth/Throat: Uvula is midline and mucous membranes are normal. No oropharyngeal exudate.  Neck: Normal range of motion. Neck supple. No thyromegaly present.  Cardiovascular: Normal rate, regular rhythm and normal heart sounds.  Pulmonary/Chest: Effort normal. No stridor. No respiratory distress. She has wheezes (few, diffuse). She has no rales.  Lymphadenopathy:    She has no cervical adenopathy.  Skin: She is not diaphoretic.  Nursing note and vitals reviewed.    UC Treatments / Results  Labs (all labs ordered are listed, but only abnormal results are displayed) Labs Reviewed - No data to display  EKG None  Radiology Dg Chest 2 View  Result Date: 04/28/2018 CLINICAL DATA:  Shortness of breath with exertion.  Cough. EXAM: CHEST - 2 VIEW COMPARISON:  None. FINDINGS: Normal heart, mediastinum and hila. The lungs are clear.  No pleural effusion or pneumothorax. Skeletal structures are unremarkable. IMPRESSION: No active cardiopulmonary disease. Electronically Signed   By: Amie Portland M.D.   On: 04/28/2018 12:12    Procedures Procedures (including critical care time)  Medications Ordered in UC Medications - No data to display  Initial Impression / Assessment and Plan / UC Course  I have reviewed the triage vital signs and the nursing notes.  Pertinent labs & imaging results that were available during my care of the patient were reviewed by me and considered in my medical decision making (see chart for details).      Final Clinical Impressions(s) / UC Diagnoses   Final diagnoses:  Cough  Wheezing    ED Prescriptions    Medication Sig Dispense Auth. Provider   predniSONE (DELTASONE) 20 MG tablet 3 tabs po qd x 2 days, then 2 tabs po qd x 2 days, then 1 tab po qd x 2 days, then half a tab po qd x 2 days 13 tablet Yerik Zeringue, Pamala Hurry, MD     1. x-ray results (normal/negative) and diagnosis reviewed with patient 2. rx as per orders above;  reviewed possible side  effects, interactions, risks and benefits  3. Recommend continue albuterol inhaler 4. Follow-up prn if symptoms worsen or don't improve   Controlled Substance Prescriptions [place] Controlled Substance Registry consulted? Not Applicable   Payton Mccallum, MD 04/28/18 1301

## 2018-04-28 NOTE — ED Triage Notes (Signed)
Patient in today c/o continued cough and wheezing. Patient was seen 04/24/18 and given Zpak and Albuterol inhaler. Patient states she is not getting any better.

## 2018-06-14 ENCOUNTER — Other Ambulatory Visit
Admission: RE | Admit: 2018-06-14 | Discharge: 2018-06-14 | Disposition: A | Discharge status: Home / Self Care | Payer: 59 | Source: Ambulatory Visit | Attending: Family Medicine | Admitting: Family Medicine

## 2018-06-14 LAB — RAPID HIV SCREEN (HIV 1/2 AB+AG)
HIV 1/2 ANTIBODIES: NONREACTIVE
HIV-1 P24 ANTIGEN - HIV24: NONREACTIVE

## 2018-08-14 ENCOUNTER — Other Ambulatory Visit: Payer: Self-pay | Admitting: Obstetrics and Gynecology

## 2018-08-16 ENCOUNTER — Other Ambulatory Visit: Payer: Self-pay

## 2018-08-16 MED ORDER — NORETHIN-ETH ESTRAD-FE BIPHAS 1 MG-10 MCG / 10 MCG PO TABS: 1.0000 | ORAL_TABLET | Freq: Every day | ORAL | 2 refills | Status: DC

## 2018-08-16 NOTE — Telephone Encounter (Signed)
The patient went to pick up her BCPs and the pharmacy is stating she cannot get them yet because it is too early; however, the patient states she was told by [name] to take them continuously and not stop so she runs out before they are due to be picked up and is asking for a sample pack of Lo-Lo to get her through until she can get her script picked up.  She is asking to pick them up today, please advise, thanks.

## 2018-08-25 ENCOUNTER — Encounter: Payer: 59 | Admitting: Obstetrics and Gynecology

## 2018-08-31 ENCOUNTER — Ambulatory Visit (INDEPENDENT_AMBULATORY_CARE_PROVIDER_SITE_OTHER): Payer: 59 | Admitting: Obstetrics and Gynecology

## 2018-08-31 ENCOUNTER — Other Ambulatory Visit (HOSPITAL_COMMUNITY)
Admission: RE | Admit: 2018-08-31 | Discharge: 2018-08-31 | Disposition: A | Discharge status: Home / Self Care | Payer: 59 | Source: Ambulatory Visit | Attending: Obstetrics and Gynecology | Admitting: Obstetrics and Gynecology

## 2018-08-31 ENCOUNTER — Encounter: Payer: Self-pay | Admitting: Obstetrics and Gynecology

## 2018-08-31 VITALS — BP 142/84 | HR 90 | Ht 61.0 in | Wt 193.4 lb

## 2018-08-31 DIAGNOSIS — Z01419 Encounter for gynecological examination (general) (routine) without abnormal findings: Secondary | ICD-10-CM | POA: Insufficient documentation

## 2018-08-31 DIAGNOSIS — Z862 Personal history of diseases of the blood and blood-forming organs and certain disorders involving the immune mechanism: Secondary | ICD-10-CM

## 2018-08-31 DIAGNOSIS — Z6836 Body mass index (BMI) 36.0-36.9, adult: Secondary | ICD-10-CM

## 2018-08-31 MED ORDER — PANTOPRAZOLE SODIUM 40 MG PO TBEC: 40.0000 mg | DELAYED_RELEASE_TABLET | ORAL | 4 refills | Status: DC

## 2018-08-31 MED ORDER — ALBUTEROL SULFATE HFA 108 (90 BASE) MCG/ACT IN AERS: 1.0000 | INHALATION_SPRAY | Freq: Four times a day (QID) | RESPIRATORY_TRACT | 2 refills | Status: DC | PRN

## 2018-08-31 MED ORDER — NORETHIN-ETH ESTRAD-FE BIPHAS 1 MG-10 MCG / 10 MCG PO TABS: 1.0000 | ORAL_TABLET | Freq: Every day | ORAL | 2 refills | Status: DC

## 2018-08-31 NOTE — Progress Notes (Signed)
Subjective:   Misty CabalSusan B Face is a 48 y.o. 482P0 Caucasian female here for a routine well-woman exam.  No LMP recorded. (Menstrual status: Oral contraceptives).    Current complaints: none PCP: me       does desire labs  Social History: Sexual: heterosexual Marital Status: married Living situation: with family Occupation: Engineer, civil (consulting)realtor assistant Tobacco/alcohol: no tobacco use Illicit drugs: no history of illicit drug use  The following portions of the patient's history were reviewed and updated as appropriate: allergies, current medications, past family history, past medical history, past social history, past surgical history and problem list.  Past Medical History Past Medical History:  Diagnosis Date  . Cyst of ovary   . Elevated cholesterol   . Heavy menstrual bleeding   . Peptic ulcer     Past Surgical History Past Surgical History:  Procedure Laterality Date  . CESAREAN SECTION  [id number],2006  . OOPHORECTOMY Right 2011  . TUBAL LIGATION  2011    Gynecologic History G2P0  No LMP recorded. (Menstrual status: Oral contraceptives). Contraception: OCP (estrogen/progesterone) and tubal ligation Last Pap: 03/2015. Results were: normal Last mammogram: 07/2017. Results were: normal   Obstetric History OB History  Gravida Para Term Preterm AB Living  2            SAB TAB Ectopic Multiple Live Births               # Outcome Date GA Lbr Len/2nd Weight Sex Delivery Anes PTL Lv  2 Gravida 2006    F CS-Unspec     1 Gravida 2003    M CS-Unspec       Current Medications Current Outpatient Medications on File Prior to Visit  Medication Sig Dispense Refill  . albuterol (PROVENTIL HFA;VENTOLIN HFA) 108 (90 Base) MCG/ACT inhaler Inhale 1-2 puffs into the lungs every 6 (six) hours as needed for wheezing or shortness of breath. 1 Inhaler 0  . LO LOESTRIN FE 1 MG-10 MCG / 10 MCG tablet TAKE 1 TABLET BY MOUTH EVERY DAY 84 tablet 2  . pantoprazole (PROTONIX) 40 MG tablet Take 1 tablet by  mouth 1 day or 1 dose. Please tell patient that her PCP will be the one to refill her prescriptions after this. Thank you.    . Aspirin-Acetaminophen-Caffeine [id number] MG PACK Take 1 Package by mouth as needed.    Marland Kitchen. azithromycin (ZITHROMAX Z-PAK) 250 MG tablet 2 tabs po once day 1, then 1 tab po qd for next 4 days (Patient not taking: Reported on 08/31/2018) 6 each 0  . predniSONE (DELTASONE) 20 MG tablet 3 tabs po qd x 2 days, then 2 tabs po qd x 2 days, then 1 tab po qd x 2 days, then half a tab po qd x 2 days (Patient not taking: Reported on 08/31/2018) 13 tablet 0   No current facility-administered medications on file prior to visit.     Review of Systems Patient denies any headaches, blurred vision, shortness of breath, chest pain, abdominal pain, problems with bowel movements, urination, or intercourse.  Objective:  BP (!) 142/84   Pulse 90   Ht 5\' 1"  (1.549 m)   Wt 193 lb 6.4 oz (87.7 kg)   BMI 36.54 kg/m  Physical Exam  General:  Well developed, well nourished, no acute distress. She is alert and oriented x3. Skin:  Warm and dry Neck:  Midline trachea, no thyromegaly or nodules Cardiovascular: Regular rate and rhythm, no murmur heard Lungs:  Effort normal, all lung fields  clear to auscultation bilaterally Breasts:  No dominant palpable mass, retraction, or nipple discharge Abdomen:  Soft, non tender, no hepatosplenomegaly or masses Pelvic:  External genitalia is normal in appearance.  The vagina is normal in appearance. The cervix is bulbous, no CMT.  Thin prep pap is done with HR HPV cotesting. Uterus is felt to be normal size, shape, and contour.  No adnexal masses or tenderness noted. Extremities:  No swelling or varicosities noted Psych:  She has a normal mood and affect  Assessment:   Healthy well-woman exam Obesity/BMI 36 H/o anemia secondary to menorrhagia  Plan:  Labs obtained- will follow up accordingly F/U 1 year for AE, or sooner if needed Mammogram  ordered  Misty Welch, CNM

## 2018-09-01 LAB — CYTOLOGY - PAP
ADEQUACY: ABSENT
DIAGNOSIS: NEGATIVE
HPV: NOT DETECTED

## 2018-09-02 ENCOUNTER — Other Ambulatory Visit: Payer: Self-pay | Admitting: Obstetrics and Gynecology

## 2018-09-02 LAB — COMPREHENSIVE METABOLIC PANEL
ALT: 21 IU/L (ref 0–32)
AST: 20 IU/L (ref 0–40)
Albumin/Globulin Ratio: 1.6 (ref 1.2–2.2)
Albumin: 4.5 g/dL (ref 3.8–4.8)
Alkaline Phosphatase: 75 IU/L (ref 39–117)
BUN / CREAT RATIO: 9 (ref 9–23)
BUN: 8 mg/dL (ref 6–24)
Bilirubin Total: 0.3 mg/dL (ref 0.0–1.2)
CALCIUM: 10 mg/dL (ref 8.7–10.2)
CO2: 23 mmol/L (ref 20–29)
CREATININE: 0.88 mg/dL (ref 0.57–1.00)
Chloride: 104 mmol/L (ref 96–106)
GFR calc Af Amer: 91 mL/min/{1.73_m2} (ref >59)
GFR calc non Af Amer: 79 mL/min/{1.73_m2} (ref >59)
GLOBULIN, TOTAL: 2.8 g/dL (ref 1.5–4.5)
Glucose: 78 mg/dL (ref 65–99)
POTASSIUM: 4.7 mmol/L (ref 3.5–5.2)
SODIUM: 142 mmol/L (ref 134–144)
Total Protein: 7.3 g/dL (ref 6.0–8.5)

## 2018-09-02 LAB — CBC
Hematocrit: 41.4 % (ref 34.0–46.6)
Hemoglobin: 13.9 g/dL (ref 11.1–15.9)
MCH: 28.3 pg (ref 26.6–33.0)
MCHC: 33.6 g/dL (ref 31.5–35.7)
MCV: 84 fL (ref 79–97)
PLATELETS: 324 10*3/uL (ref 150–450)
RBC: 4.91 x10E6/uL (ref 3.77–5.28)
RDW: 11.7 % (ref 11.7–15.4)
WBC: 7.6 10*3/uL (ref 3.4–10.8)

## 2018-09-02 LAB — LIPID PANEL
CHOL/HDL RATIO: 5.4 ratio — AB (ref 0.0–4.4)
Cholesterol, Total: 266 mg/dL — ABNORMAL HIGH (ref 100–199)
HDL: 49 mg/dL (ref >39)
LDL Calculated: 201 mg/dL — ABNORMAL HIGH (ref 0–99)
TRIGLYCERIDES: 78 mg/dL (ref 0–149)
VLDL Cholesterol Cal: 16 mg/dL (ref 5–40)

## 2018-09-02 LAB — VITAMIN D 25 HYDROXY (VIT D DEFICIENCY, FRACTURES): Vit D, 25-Hydroxy: 25.7 ng/mL — ABNORMAL LOW (ref 30.0–100.0)

## 2018-09-02 LAB — HEMOGLOBIN A1C
ESTIMATED AVERAGE GLUCOSE: 94 mg/dL
Hgb A1c MFr Bld: 4.9 % (ref 4.8–5.6)

## 2018-09-02 LAB — TSH: TSH: 2.45 u[IU]/mL (ref 0.450–4.500)

## 2018-09-02 MED ORDER — VITAMIN D (ERGOCALCIFEROL) 1.25 MG (50000 UNIT) PO CAPS: 50000.0000 [IU] | ORAL_CAPSULE | ORAL | 4 refills | Status: DC

## 2018-11-22 ENCOUNTER — Ambulatory Visit: Payer: 59

## 2019-01-20 ENCOUNTER — Ambulatory Visit: Payer: 59

## 2019-01-27 ENCOUNTER — Ambulatory Visit: Payer: 59

## 2019-02-08 ENCOUNTER — Ambulatory Visit
Admission: RE | Admit: 2019-02-08 | Discharge: 2019-02-08 | Disposition: A | Discharge status: Home / Self Care | Payer: 59 | Source: Ambulatory Visit | Attending: Obstetrics and Gynecology | Admitting: Obstetrics and Gynecology

## 2019-02-08 ENCOUNTER — Other Ambulatory Visit: Payer: Self-pay

## 2019-02-08 DIAGNOSIS — R928 Other abnormal and inconclusive findings on diagnostic imaging of breast: Secondary | ICD-10-CM | POA: Insufficient documentation

## 2019-02-08 DIAGNOSIS — Z1231 Encounter for screening mammogram for malignant neoplasm of breast: Secondary | ICD-10-CM | POA: Diagnosis not present

## 2019-02-08 DIAGNOSIS — Z01419 Encounter for gynecological examination (general) (routine) without abnormal findings: Secondary | ICD-10-CM

## 2019-02-09 ENCOUNTER — Other Ambulatory Visit: Payer: Self-pay | Admitting: Obstetrics and Gynecology

## 2019-02-09 DIAGNOSIS — N6489 Other specified disorders of breast: Secondary | ICD-10-CM

## 2019-02-09 DIAGNOSIS — R928 Other abnormal and inconclusive findings on diagnostic imaging of breast: Secondary | ICD-10-CM

## 2019-02-14 ENCOUNTER — Telehealth: Payer: Self-pay | Admitting: Obstetrics and Gynecology

## 2019-02-14 NOTE — Telephone Encounter (Signed)
The patient called to check the status of her mammogram results the patient is requesting a call back if possible. Pt aware Mel and Amy out of office and will return tomorrow 7/28. Please advise.

## 2019-02-15 NOTE — Telephone Encounter (Signed)
Called pt left detailed message.

## 2019-02-16 ENCOUNTER — Other Ambulatory Visit: Payer: Self-pay

## 2019-02-16 ENCOUNTER — Ambulatory Visit
Admission: RE | Admit: 2019-02-16 | Discharge: 2019-02-16 | Disposition: A | Discharge status: Home / Self Care | Payer: 59 | Source: Ambulatory Visit | Attending: Obstetrics and Gynecology | Admitting: Obstetrics and Gynecology

## 2019-02-16 DIAGNOSIS — N6489 Other specified disorders of breast: Secondary | ICD-10-CM

## 2019-02-16 DIAGNOSIS — R928 Other abnormal and inconclusive findings on diagnostic imaging of breast: Secondary | ICD-10-CM

## 2019-02-18 ENCOUNTER — Other Ambulatory Visit: Payer: 59

## 2019-02-18 ENCOUNTER — Ambulatory Visit: Payer: 59

## 2019-06-16 IMAGING — CT CT ABD-PELV W/ CM
2 of 5 series · 16 of 46 positions shown, 18 images · IV contrast (APPLIED)
Comparison: None.

CLINICAL DATA: Abdominal pain. Gastroenteritis or colitis
suspected. Abnormal ultrasound.

EXAM:
CT ABDOMEN AND PELVIS WITH CONTRAST
TECHNIQUE: Multidetector CT imaging of the abdomen and pelvis was performed
using the standard protocol following bolus administration of
intravenous contrast.
CONTRAST:  100mL LH19P1-V44 IOPAMIDOL (LH19P1-V44) INJECTION 61%

[Series 2: routine abd/pel with · axial · 0.72mm/px · z∈[-469,-74]mm · 13 of 89 slices shown, 15 images]
[im 5/89  soft-tissue]
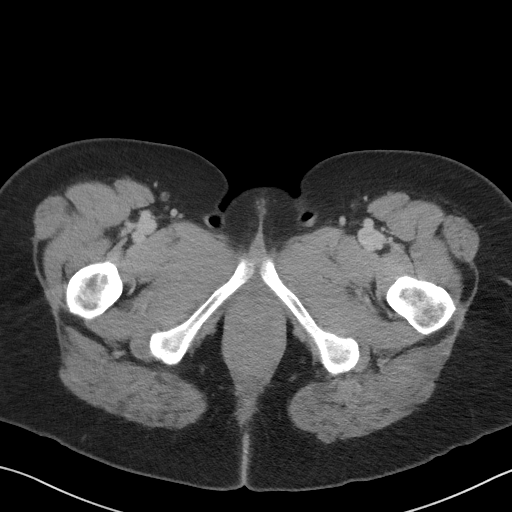
[im 5/89  bone]
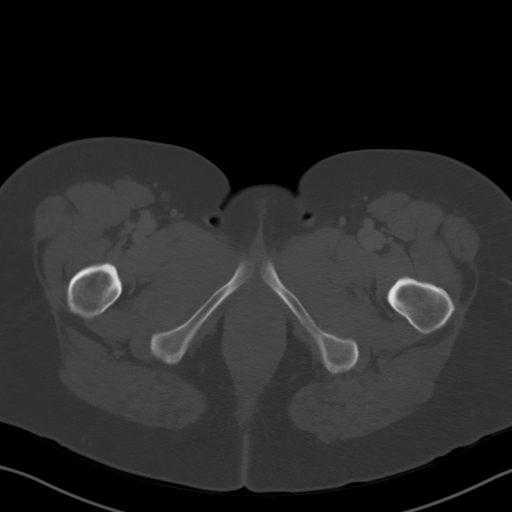
[im 10/89  soft-tissue]
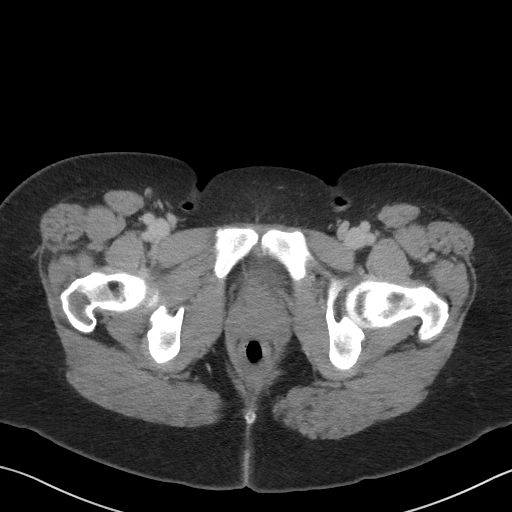
[im 20/89  soft-tissue]
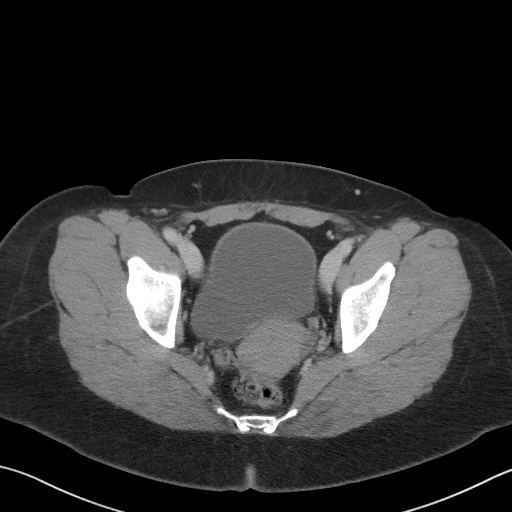
[im 25/89  soft-tissue]
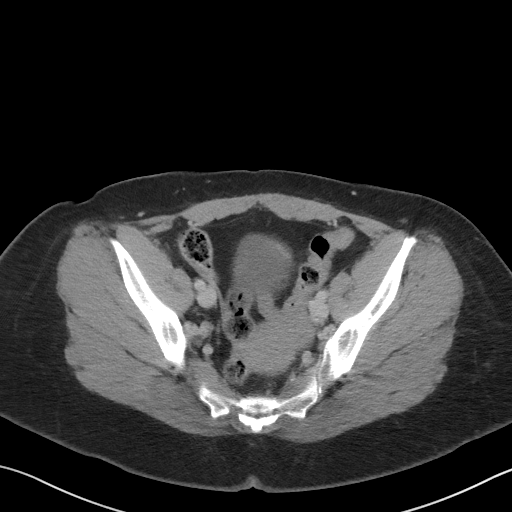
[im 30/89  soft-tissue]
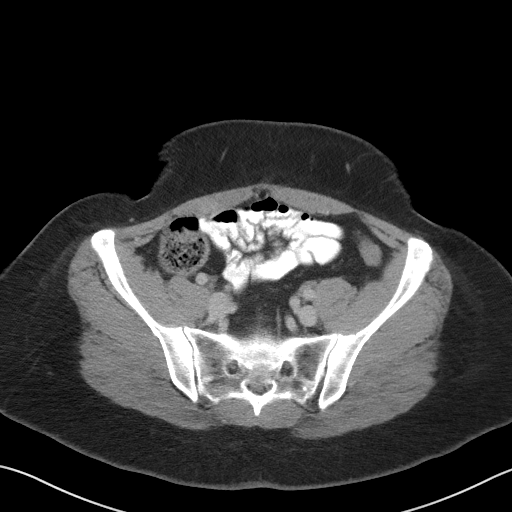
[im 40/89  soft-tissue]
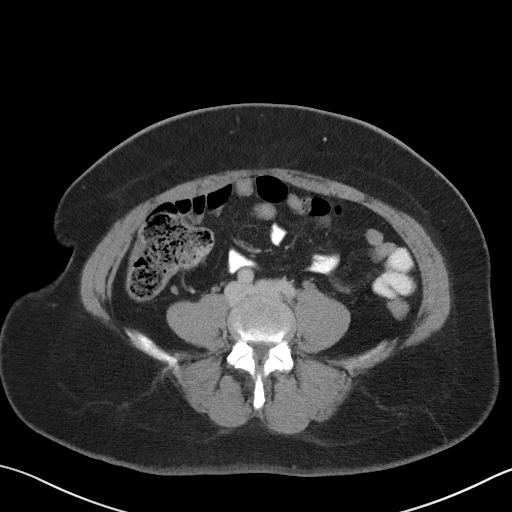
[im 45/89  soft-tissue]
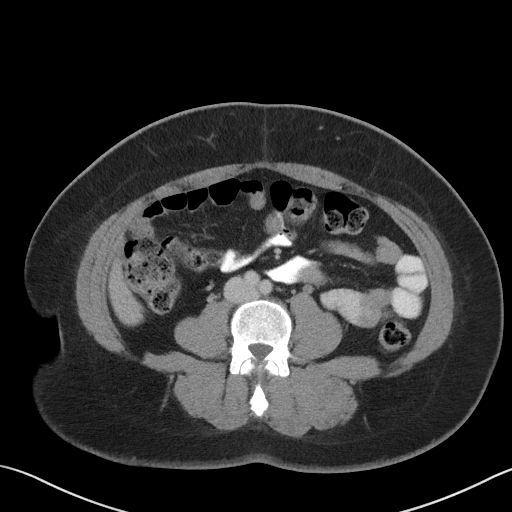
[im 49/89  soft-tissue]
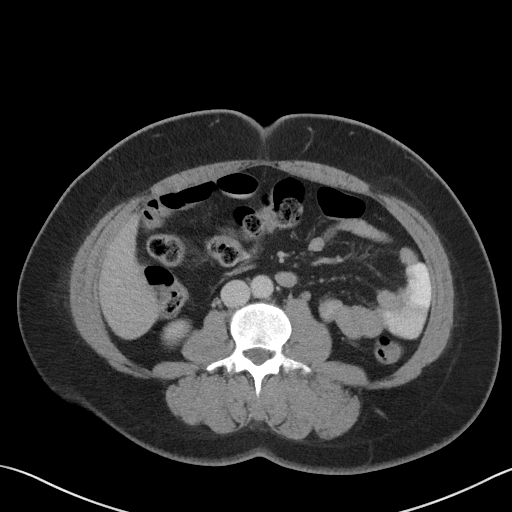
[im 59/89  soft-tissue]
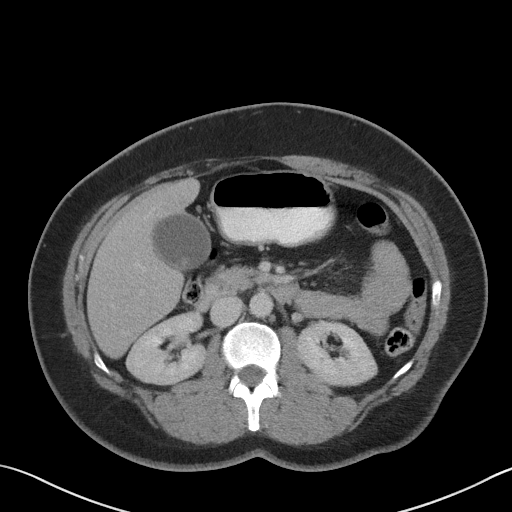
[im 59/89  bone]
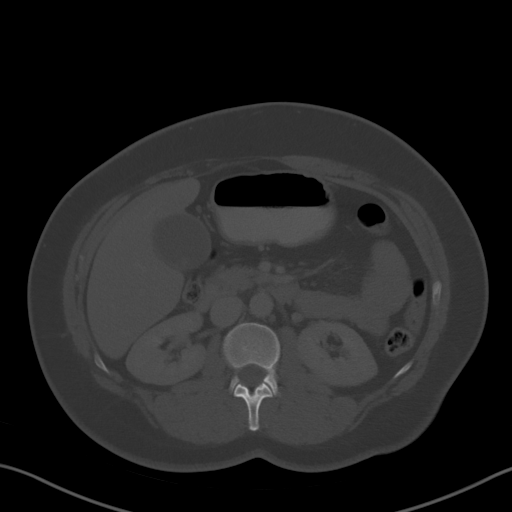
[im 64/89  soft-tissue]
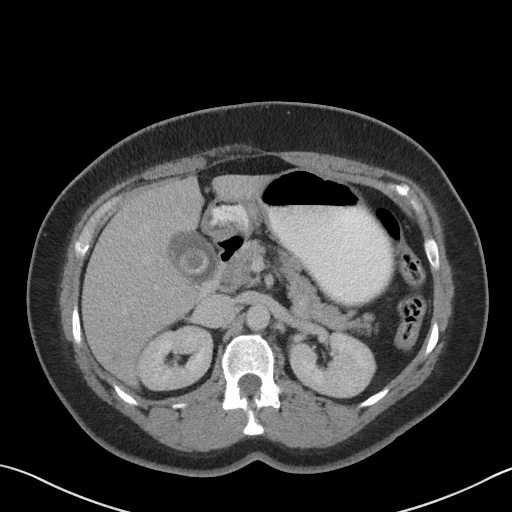
[im 69/89  soft-tissue]
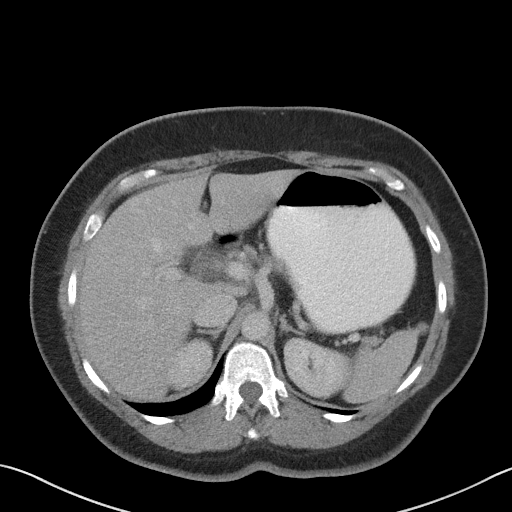
[im 79/89  soft-tissue]
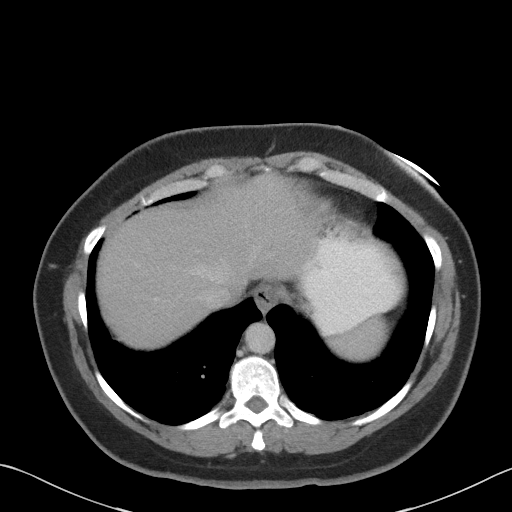
[im 84/89  soft-tissue]
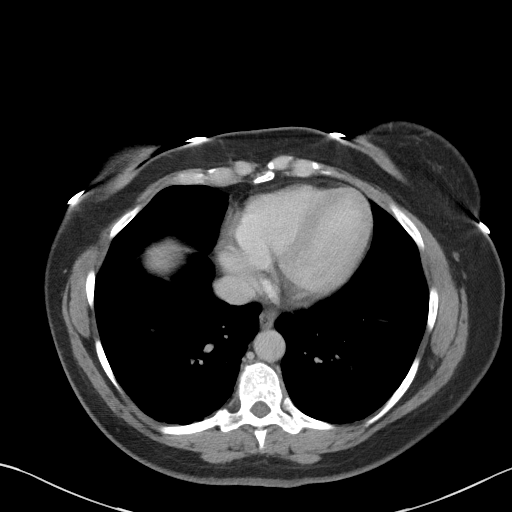

[Series 5: coronal st · coronal · 0.68mm/px · 3 of 91 slices shown]
[im 31/91  soft-tissue]
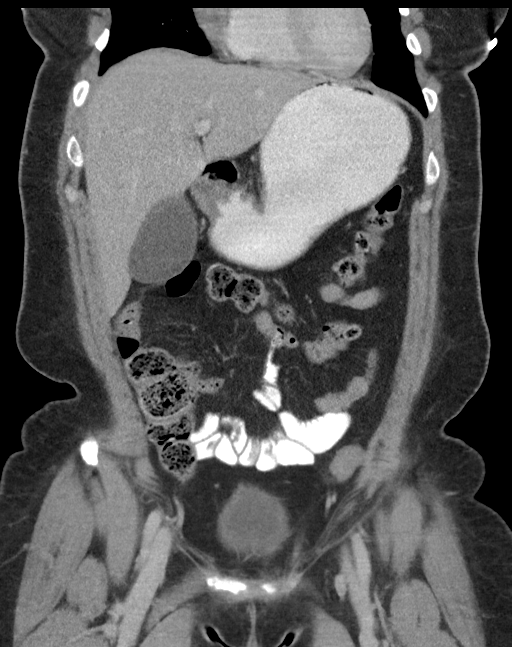
[im 41/91  soft-tissue]
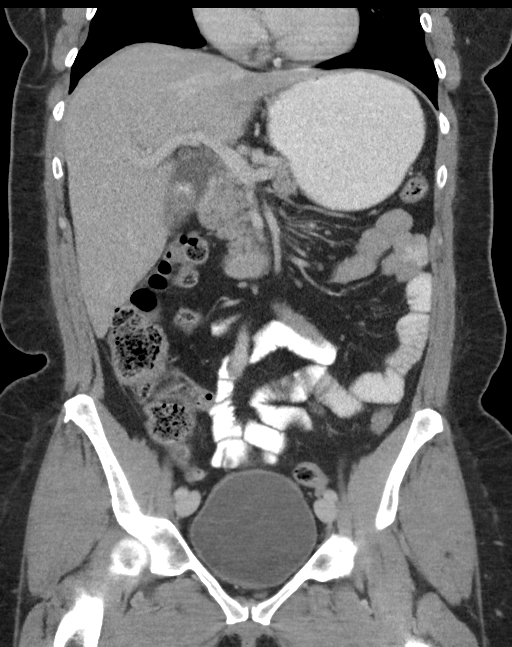
[im 51/91  soft-tissue]
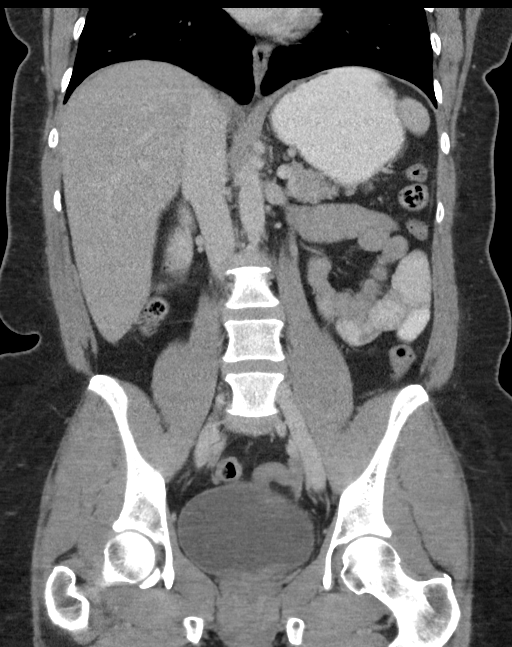

[16 of 46 positions shown; findings below may reference images not displayed]

FINDINGS: Lower chest: The lung bases are clear. The heart size is normal. No
significant pleural or pericardial effusion is present.

Hepatobiliary: A gallstone measures 2.7 cm on CT. There is mild wall
thickening of the gallbladder versus mild pole like pericholecystic
fluid. The liver is unremarkable. The common bile duct is within
normal limits.

Pancreas: Unremarkable. No pancreatic ductal dilatation or
surrounding inflammatory changes.

Spleen: Normal in size without focal abnormality.

Adrenals/Urinary Tract: The adrenal glands are normal bilaterally.
Kidneys and ureters are within normal limits. The urinary bladder is
within normal limits.

Stomach/Bowel: The stomach is moderately distended. No obstructing
lesion is present. The duodenum is unremarkable. The small bowel is
within normal limits. The appendix and cecum are visualized and
within normal limits. The ascending and transverse colon are normal.
The descending and sigmoid colon are within normal limits.

Vascular/Lymphatic: No significant vascular findings are present. No
enlarged abdominal or pelvic lymph nodes.

Reproductive: Uterus and bilateral adnexa are unremarkable.

Musculoskeletal: Tear body heights and alignment are maintained.
Mild rightward curvature of the thoracolumbar spine centered at T12.
Asymmetric endplate changes are present on the right at L5-S1.
IMPRESSION: 1. 2.7 cm gallstone with gallbladder wall thickening or
pericholecystic fluid remains concerning for acute cholecystitis.
2. No other acute or focal abnormality to explain the patient's
epigastric pain.
3. Mild curvature of the thoracolumbar spine.

## 2019-07-11 ENCOUNTER — Telehealth: Payer: Self-pay | Admitting: Certified Nurse Midwife

## 2019-07-11 ENCOUNTER — Other Ambulatory Visit: Payer: Self-pay

## 2019-07-11 ENCOUNTER — Telehealth: Payer: Self-pay

## 2019-07-11 ENCOUNTER — Other Ambulatory Visit (INDEPENDENT_AMBULATORY_CARE_PROVIDER_SITE_OTHER): Payer: 59 | Admitting: Certified Nurse Midwife

## 2019-07-11 ENCOUNTER — Other Ambulatory Visit: Payer: Self-pay | Admitting: Certified Nurse Midwife

## 2019-07-11 DIAGNOSIS — R319 Hematuria, unspecified: Secondary | ICD-10-CM

## 2019-07-11 DIAGNOSIS — N390 Urinary tract infection, site not specified: Secondary | ICD-10-CM

## 2019-07-11 LAB — POCT URINALYSIS DIPSTICK
Bilirubin, UA: NEGATIVE
Glucose, UA: NEGATIVE
Ketones, UA: NEGATIVE
Nitrite, UA: POSITIVE
Protein, UA: POSITIVE — AB
Spec Grav, UA: 1.015 (ref 1.010–1.025)
Urobilinogen, UA: 0.2 E.U./dL
pH, UA: 7 (ref 5.0–8.0)

## 2019-07-11 MED ORDER — SULFAMETHOXAZOLE-TRIMETHOPRIM 800-160 MG PO TABS: 1.0000 | ORAL_TABLET | Freq: Two times a day (BID) | ORAL | 0 refills | Status: DC

## 2019-07-11 NOTE — Telephone Encounter (Signed)
Patient is having UTI symptoms. Would like to be seen by Misty Welch since [name] is no longer here. Patient would like to drop a specimen off to be tested. Please call to advise

## 2019-07-11 NOTE — Progress Notes (Signed)
Urine dip positive for UTI, orders placed for treatment. Urine sent for culture. PT allergic to macrobid. Bactrim ordered.    Philip Aspen, CNM

## 2019-07-11 NOTE — Progress Notes (Signed)
Patient here for urine drop-off.  Patient c/o urinary urgency, voiding small amounts and burning x2 days.  Taking AZO with no relief.

## 2019-07-11 NOTE — Telephone Encounter (Signed)
mychart message sent to patient

## 2019-07-13 ENCOUNTER — Other Ambulatory Visit: Payer: Self-pay | Admitting: Certified Nurse Midwife

## 2019-07-13 LAB — URINE CULTURE

## 2019-07-13 MED ORDER — CIPROFLOXACIN HCL 250 MG PO TABS: 250.0000 mg | ORAL_TABLET | Freq: Two times a day (BID) | ORAL | 0 refills | Status: AC

## 2019-07-13 NOTE — Progress Notes (Signed)
Urine culture result reviewed antibiotic order changed. Pt notified.   Philip Aspen, CNM

## 2019-07-14 ENCOUNTER — Other Ambulatory Visit: Payer: Self-pay

## 2019-07-14 ENCOUNTER — Telehealth: Payer: Self-pay

## 2019-07-14 MED ORDER — PHENAZOPYRIDINE HCL 200 MG PO TABS: 200.0000 mg | ORAL_TABLET | Freq: Three times a day (TID) | ORAL | 0 refills | Status: DC | PRN

## 2019-07-14 MED ORDER — SULFAMETHOXAZOLE-TRIMETHOPRIM 800-160 MG PO TABS: 1.0000 | ORAL_TABLET | Freq: Two times a day (BID) | ORAL | 0 refills | Status: DC

## 2019-07-14 NOTE — Telephone Encounter (Signed)
Pt says she forgot that the she's allergic to the medication [facility] sent in, for her UTI. Would like another medication prescribed today. Please call patient

## 2019-07-14 NOTE — Telephone Encounter (Signed)
She can be prescribed Bactrim DS BID x 7 days

## 2019-07-14 NOTE — Telephone Encounter (Signed)
Called patient to give Dr. Andreas Blower recommendation.  Patient was told by AT that Bactrim would not work to treat the bacteria that grew off her urine culture.  Dr. Marcelline Mates reviewed urine lab and agrees that patient should take Bactrim DS BID x7 days.  Patient aware, agreed and verbalized understanding.  Rx sent.

## 2019-07-21 ENCOUNTER — Telehealth: Payer: Self-pay | Admitting: Obstetrics and Gynecology

## 2019-07-21 MED ORDER — LEVOFLOXACIN 500 MG PO TABS: 500.0000 mg | ORAL_TABLET | Freq: Every day | ORAL | 0 refills | Status: DC

## 2019-07-21 NOTE — Telephone Encounter (Signed)
Pt called to check on the medication issues she was having. Pt stated that she was allergy to Cipro and unable to take it. Dr. Amalia Hailey and Deneise Lever was called to see which medication the pt could take due to be allergy to all the medication that would treat her UTI.  Deneise Lever suggested that I contact Dr. Amalia Hailey. Dr. Amalia Hailey suggested that the patient take Levquin 500 mg 1 tablet a day for 3 days. Pt was informed of the changes in her medication.

## 2019-07-21 NOTE — Telephone Encounter (Signed)
Pt is having a reaction to the meds for her uti. Pt is requesting a different med sent over to the pharmacy please

## 2019-07-25 NOTE — Telephone Encounter (Signed)
Spoke with pt to see how she doing since the last call to the office. Pt stated that she was doing well after the first dose. Pt stated that she would like to cancel her appointment with Pattricia Boss and schedule her annual exam with Dr. Valentino Saxon. Appointments were changed and rescheduled.

## 2019-07-26 ENCOUNTER — Other Ambulatory Visit: Payer: 59

## 2019-08-01 NOTE — Telephone Encounter (Signed)
error 

## 2019-08-31 ENCOUNTER — Encounter: Payer: 59 | Admitting: Certified Nurse Midwife

## 2019-10-04 NOTE — Patient Instructions (Addendum)
Preventive Care 40-48 Years Old, Female Preventive care refers to visits with your health care provider and lifestyle choices that can promote health and wellness. This includes:  A yearly physical exam. This may also be called an annual well check.  Regular dental visits and eye exams.  Immunizations.  Screening for certain conditions.  Healthy lifestyle choices, such as eating a healthy diet, getting regular exercise, not using drugs or products that contain nicotine and tobacco, and limiting alcohol use. What can I expect for my preventive care visit? Physical exam Your health care provider will check your:  Height and weight. This may be used to calculate body mass index (BMI), which tells if you are at a healthy weight.  Heart rate and blood pressure.  Skin for abnormal spots. Counseling Your health care provider may ask you questions about your:  Alcohol, tobacco, and drug use.  Emotional well-being.  Home and relationship well-being.  Sexual activity.  Eating habits.  Work and work environment.  Method of birth control.  Menstrual cycle.  Pregnancy history. What immunizations do I need?  Influenza (flu) vaccine  This is recommended every year. Tetanus, diphtheria, and pertussis (Tdap) vaccine  You may need a Td booster every 10 years. Varicella (chickenpox) vaccine  You may need this if you have not been vaccinated. Zoster (shingles) vaccine  You may need this after age 60. Measles, mumps, and rubella (MMR) vaccine  You may need at least one dose of MMR if you were born in 1957 or later. You may also need a second dose. Pneumococcal conjugate (PCV13) vaccine  You may need this if you have certain conditions and were not previously vaccinated. Pneumococcal polysaccharide (PPSV23) vaccine  You may need one or two doses if you smoke cigarettes or if you have certain conditions. Meningococcal conjugate (MenACWY) vaccine  You may need this if you  have certain conditions. Hepatitis A vaccine  You may need this if you have certain conditions or if you travel or work in places where you may be exposed to hepatitis A. Hepatitis B vaccine  You may need this if you have certain conditions or if you travel or work in places where you may be exposed to hepatitis B. Haemophilus influenzae type b (Hib) vaccine  You may need this if you have certain conditions. Human papillomavirus (HPV) vaccine  If recommended by your health care provider, you may need three doses over 6 months. You may receive vaccines as individual doses or as more than one vaccine together in one shot (combination vaccines). Talk with your health care provider about the risks and benefits of combination vaccines. What tests do I need? Blood tests  Lipid and cholesterol levels. These may be checked every 5 years, or more frequently if you are over 50 years old.  Hepatitis C test.  Hepatitis B test. Screening  Lung cancer screening. You may have this screening every year starting at age 55 if you have a 30-pack-year history of smoking and currently smoke or have quit within the past 15 years.  Colorectal cancer screening. All adults should have this screening starting at age 50 and continuing until age 75. Your health care provider may recommend screening at age 45 if you are at increased risk. You will have tests every 1-10 years, depending on your results and the type of screening test.  Diabetes screening. This is done by checking your blood sugar (glucose) after you have not eaten for a while (fasting). You may have this   done every 1-3 years.  Mammogram. This may be done every 1-2 years. Talk with your health care provider about when you should start having regular mammograms. This may depend on whether you have a family history of breast cancer.  BRCA-related cancer screening. This may be done if you have a family history of breast, ovarian, tubal, or peritoneal  cancers.  Pelvic exam and Pap test. This may be done every 3 years starting at age 70. Starting at age 87, this may be done every 5 years if you have a Pap test in combination with an HPV test. Other tests  Sexually transmitted disease (STD) testing.  Bone density scan. This is done to screen for osteoporosis. You may have this scan if you are at high risk for osteoporosis. Follow these instructions at home: Eating and drinking  Eat a diet that includes fresh fruits and vegetables, whole grains, lean protein, and low-fat dairy.  Take vitamin and mineral supplements as recommended by your health care provider.  Do not drink alcohol if: ? Your health care provider tells you not to drink. ? You are pregnant, may be pregnant, or are planning to become pregnant.  If you drink alcohol: ? Limit how much you have to 0-1 drink a day. ? Be aware of how much alcohol is in your drink. In the [place] one drink equals one 12 oz bottle of beer (355 mL), one 5 oz glass of wine (148 mL), or one 1 oz glass of hard liquor (44 mL). Lifestyle  Take daily care of your teeth and gums.  Stay active. Exercise for at least 30 minutes on 5 or more days each week.  Do not use any products that contain nicotine or tobacco, such as cigarettes, e-cigarettes, and chewing tobacco. If you need help quitting, ask your health care provider.  If you are sexually active, practice safe sex. Use a condom or other form of birth control (contraception) in order to prevent pregnancy and STIs (sexually transmitted infections).  If told by your health care provider, take low-dose aspirin daily starting at age 59. What's next?  Visit your health care provider once a year for a well check visit.  Ask your health care provider how often you should have your eyes and teeth checked.  Stay up to date on all vaccines. This information is not intended to replace advice given to you by your health care provider. Make sure you  discuss any questions you have with your health care provider. Document Revised: 03/18/2018 Document Reviewed: 03/18/2018 Elsevier Patient Education  2020 [employer] Breast self-awareness is knowing how your breasts look and feel. Doing breast self-awareness is important. It allows you to catch a breast problem early while it is still small and can be treated. All women should do breast self-awareness, including women who have had breast implants. Tell your doctor if you notice a change in your breasts. What you need:  A mirror.  A well-lit room. How to do a breast self-exam A breast self-exam is one way to learn what is normal for your breasts and to check for changes. To do a breast self-exam: Look for changes  1. Take off all the clothes above your waist. 2. Stand in front of a mirror in a room with good lighting. 3. Put your hands on your hips. 4. Push your hands down. 5. Look at your breasts and nipples in the mirror to see if one breast or nipple looks different  other. Check to see if: ? The shape of one breast is different. ? The size of one breast is different. ? There are wrinkles, dips, and bumps in one breast and not the other. 6. Look at each breast for changes in the skin, such as: ? Redness. ? Scaly areas. 7. Look for changes in your nipples, such as: ? Liquid around the nipples. ? Bleeding. ? Dimpling. ? Redness. ? A change in where the nipples are. Feel for changes  1. Lie on your back on the floor. 2. Feel each breast. To do this, follow these steps: ? Pick a breast to feel. ? Put the arm closest to that breast above your head. ? Use your other arm to feel the nipple area of your breast. Feel the area with the pads of your three middle fingers by making small circles with your fingers. For the first circle, press lightly. For the second circle, press harder. For the third circle, press even harder. ? Keep making circles with  your fingers at the different pressures as you move down your breast. Stop when you feel your ribs. ? Move your fingers a little toward the center of your body. ? Start making circles with your fingers again, this time going up until you reach your collarbone. ? Keep making up-and-down circles until you reach your armpit. Remember to keep using the three pressures. ? Feel the other breast in the same way. 3. Sit or stand in the tub or shower. 4. With soapy water on your skin, feel each breast the same way you did in step 2 when you were lying on the floor. Write down what you find Writing down what you find can help you remember what to tell your doctor. Write down:  What is normal for each breast.  Any changes you find in each breast, including: ? The kind of changes you find. ? Whether you have pain. ? Size and location of any lumps.  When you last had your menstrual period. General tips  Check your breasts every month.  If you are breastfeeding, the best time to check your breasts is after you feed your baby or after you use a breast pump.  If you get menstrual periods, the best time to check your breasts is 5-7 days after your menstrual period is over.  With time, you will become comfortable with the self-exam, and you will begin to know if there are changes in your breasts. Contact a doctor if you:  See a change in the shape or size of your breasts or nipples.  See a change in the skin of your breast or nipples, such as red or scaly skin.  Have fluid coming from your nipples that is not normal.  Find a lump or thick area that was not there before.  Have pain in your breasts.  Have any concerns about your breast health. Summary  Breast self-awareness includes looking for changes in your breasts, as well as feeling for changes within your breasts.  Breast self-awareness should be done in front of a mirror in a well-lit room.  You should check your breasts every month.  If you get menstrual periods, the best time to check your breasts is 5-7 days after your menstrual period is over.  Let your doctor know of any changes you see in your breasts, including changes in size, changes on the skin, pain or tenderness, or fluid from your nipples that is not normal. This information is not   is not intended to replace advice given to you by your health care provider. Make sure you discuss any questions you have with your health care provider. Document Revised: 02/23/2018 Document Reviewed: 02/23/2018 Elsevier Patient Education  [employer].

## 2019-10-04 NOTE — Progress Notes (Signed)
Pt present for annual exam. Pt declined flu vaccine. Pt stated that she is doing well no problems.

## 2019-10-05 ENCOUNTER — Ambulatory Visit (INDEPENDENT_AMBULATORY_CARE_PROVIDER_SITE_OTHER): Payer: 59 | Admitting: Obstetrics and Gynecology

## 2019-10-05 ENCOUNTER — Encounter: Payer: Self-pay | Admitting: Obstetrics and Gynecology

## 2019-10-05 ENCOUNTER — Other Ambulatory Visit: Payer: Self-pay

## 2019-10-05 VITALS — BP 114/75 | HR 81 | Ht 61.0 in | Wt 160.2 lb

## 2019-10-05 DIAGNOSIS — Z01419 Encounter for gynecological examination (general) (routine) without abnormal findings: Secondary | ICD-10-CM | POA: Diagnosis not present

## 2019-10-05 DIAGNOSIS — K2100 Gastro-esophageal reflux disease with esophagitis, without bleeding: Secondary | ICD-10-CM

## 2019-10-05 DIAGNOSIS — E669 Obesity, unspecified: Secondary | ICD-10-CM | POA: Diagnosis not present

## 2019-10-05 DIAGNOSIS — E785 Hyperlipidemia, unspecified: Secondary | ICD-10-CM

## 2019-10-05 DIAGNOSIS — Z1231 Encounter for screening mammogram for malignant neoplasm of breast: Secondary | ICD-10-CM

## 2019-10-05 DIAGNOSIS — J45990 Exercise induced bronchospasm: Secondary | ICD-10-CM

## 2019-10-05 DIAGNOSIS — N920 Excessive and frequent menstruation with regular cycle: Secondary | ICD-10-CM

## 2019-10-05 DIAGNOSIS — Z3041 Encounter for surveillance of contraceptive pills: Secondary | ICD-10-CM

## 2019-10-05 DIAGNOSIS — Z8639 Personal history of other endocrine, nutritional and metabolic disease: Secondary | ICD-10-CM

## 2019-10-05 HISTORY — DX: Exercise induced bronchospasm: J45.990

## 2019-10-05 HISTORY — DX: Personal history of other endocrine, nutritional and metabolic disease: Z86.39

## 2019-10-05 HISTORY — DX: Obesity, unspecified: E66.9

## 2019-10-05 HISTORY — DX: Excessive and frequent menstruation with regular cycle: N92.0

## 2019-10-05 MED ORDER — ALBUTEROL SULFATE HFA 108 (90 BASE) MCG/ACT IN AERS: 1.0000 | INHALATION_SPRAY | Freq: Four times a day (QID) | RESPIRATORY_TRACT | 3 refills | Status: DC | PRN

## 2019-10-05 MED ORDER — PANTOPRAZOLE SODIUM 40 MG PO TBEC: 40.0000 mg | DELAYED_RELEASE_TABLET | ORAL | 4 refills | Status: DC

## 2019-10-05 MED ORDER — LO LOESTRIN FE 1 MG-10 MCG / 10 MCG PO TABS: 1.0000 | ORAL_TABLET | Freq: Every day | ORAL | 3 refills | Status: DC

## 2019-10-05 NOTE — Progress Notes (Signed)
GYNECOLOGY ANNUAL PHYSICAL EXAM PROGRESS NOTE  Subjective:    Misty Welch is a 49 y.o. G2P0 married female who presents for an annual exam. She was previously a patient of [name] Tanana, [place]. The patient has no complaints today. Desires refills on several of her medications. The patient is sexually active. The patient wears seatbelts: yes. The patient participates in regular exercise: yes. Has the patient ever been transfused or tattooed?: no. The patient reports that there is not domestic violence in her life.    Gynecologic History No LMP recorded (lmp unknown). (Menstrual status: Oral contraceptives). Contraception: OCP (estrogen/progesterone) - LoLoestrin, on continuous regimen due to history of menorrhagia and anemia History of STI's: Deneis Last Pap: 08/31/2018. Results were: normal.  Denies h/o abnormal pap smears. Last mammogram: 01/2019. Results were: abnormal - possible left breast asymmetry (BIRADS 0).  F/u left breast diagnostic mammo normal.    OB History  Gravida Para Term Preterm AB Living  2 0 0 0 0 0  SAB TAB Ectopic Multiple Live Births  0 0 0 0 0    # Outcome Date GA Lbr Len/2nd Weight Sex Delivery Anes PTL Lv  2 Gravida 2006    F CS-Unspec     1 Gravida 2003    M CS-Unspec       Past Medical History:  Diagnosis Date  . Cyst of ovary   . Elevated cholesterol   . Heavy menstrual bleeding   . Peptic ulcer     Past Surgical History:  Procedure Laterality Date  . CESAREAN SECTION  [id number]  . OOPHORECTOMY Right 2011  . TUBAL LIGATION  2011    Family History  Problem Relation Age of Onset  . Heart disease Maternal Grandmother   . Alzheimer's disease Mother   . Cirrhosis Father        non-alcoholic  . Breast cancer Neg Hx     Social History   Socioeconomic History  . Marital status: Married    Spouse name: Not on file  . Number of children: Not on file  . Years of education: Not on file  . Highest education level: Not on file    Occupational History  . Not on file  Tobacco Use  . Smoking status: Never Smoker  . Smokeless tobacco: Never Used  Substance and Sexual Activity  . Alcohol use: No  . Drug use: No  . Sexual activity: Yes    Birth control/protection: Surgical, Pill  Other Topics Concern  . Not on file  Social History Narrative  . Not on file   Social Determinants of Health   Financial Resource Strain:   . Difficulty of Paying Living Expenses:   Food Insecurity:   . Worried About Programme researcher, broadcasting/film/video in the Last Year:   . Barista in the Last Year:   Transportation Needs:   . Freight forwarder (Medical):   Marland Kitchen Lack of Transportation (Non-Medical):   Physical Activity:   . Days of Exercise per Week:   . Minutes of Exercise per Session:   Stress:   . Feeling of Stress :   Social Connections:   . Frequency of Communication with Friends and Family:   . Frequency of Social Gatherings with Friends and Family:   . Attends Religious Services:   . Active Member of Clubs or Organizations:   . Attends Banker Meetings:   Marland Kitchen Marital Status:   Intimate Partner Violence:   . Fear of  Current or Ex-Partner:   . Emotionally Abused:   Marland Kitchen Physically Abused:   . Sexually Abused:     Current Outpatient Medications on File Prior to Visit  Medication Sig Dispense Refill  . albuterol (PROVENTIL HFA;VENTOLIN HFA) 108 (90 Base) MCG/ACT inhaler Inhale 1-2 puffs into the lungs every 6 (six) hours as needed for wheezing or shortness of breath. 1 Inhaler 2  . Norethindrone-Ethinyl Estradiol-Fe Biphas (LO LOESTRIN FE) 1 MG-10 MCG / 10 MCG tablet Take 1 tablet by mouth daily. Takes continuously, omitting placebo pills 84 tablet 2  . pantoprazole (PROTONIX) 40 MG tablet Take 1 tablet (40 mg total) by mouth 1 day or 1 dose. Please tell patient that her PCP will be the one to refill her prescriptions after this. Thank you. 90 tablet 4   No current facility-administered medications on file prior to  visit.    Allergies  Allergen Reactions  . Macrobid [Nitrofurantoin Monohyd Macro] Anaphylaxis  . Nitrofurantoin Anaphylaxis  . Ciprofloxacin     Dizzy spells and feeling emotional   . Penicillins     Childhood reaction      Review of Systems Constitutional: negative for chills, fatigue, fevers and sweats Eyes: negative for irritation, redness and visual disturbance Ears, nose, mouth, throat, and face: negative for hearing loss, nasal congestion, snoring and tinnitus Respiratory: negative for asthma, cough, sputum Cardiovascular: negative for chest pain, dyspnea, exertional chest pressure/discomfort, irregular heart beat, palpitations and syncope Gastrointestinal: negative for abdominal pain, change in bowel habits, nausea and vomiting Genitourinary: negative for abnormal menstrual periods, genital lesions, sexual problems and vaginal discharge, dysuria and urinary incontinence Integument/breast: negative for breast lump, breast tenderness and nipple discharge Hematologic/lymphatic: negative for bleeding and easy bruising Musculoskeletal:negative for back pain and muscle weakness Neurological: negative for dizziness, headaches, vertigo and weakness Endocrine: negative for diabetic symptoms including polydipsia, polyuria and skin dryness Allergic/Immunologic: negative for hay fever and urticaria        Objective:  Blood pressure 114/75, pulse 81, height 5\' 1"  (1.549 m), weight 160 lb 3.2 oz (72.7 kg). Body mass index is 30.27 kg/m.  General Appearance:    Alert, cooperative, no distress, appears stated age  Head:    Normocephalic, without obvious abnormality, atraumatic  Eyes:    PERRL, conjunctiva/corneas clear, EOM's intact, both eyes  Ears:    Normal external ear canals, both ears  Nose:   Nares normal, septum midline, mucosa normal, no drainage or sinus tenderness  Throat:   Lips, mucosa, and tongue normal; teeth and gums normal  Neck:   Supple, symmetrical, trachea  midline, no adenopathy; thyroid: no enlargement/tenderness/nodules; no carotid bruit or JVD  Back:     Symmetric, no curvature, ROM normal, no CVA tenderness  Lungs:     Clear to auscultation bilaterally, respirations unlabored  Chest Wall:    No tenderness or deformity   Heart:    Regular rate and rhythm, S1 and S2 normal, no murmur, rub or gallop  Breast Exam:    No tenderness, masses, or nipple abnormality  Abdomen:     Soft, non-tender, bowel sounds active all four quadrants, no masses, no organomegaly.    Genitalia:    Deferred per patient request  Rectal:    Not examined.   Extremities:   Extremities normal, atraumatic, no cyanosis or edema  Pulses:   2+ and symmetric all extremities  Skin:   Skin color, texture, turgor normal, no rashes or lesions  Lymph nodes:   Cervical, supraclavicular, and axillary nodes  normal  Neurologic:   CNII-XII intact, normal strength, sensation and reflexes throughout   .  Labs:  Lab Results  Component Value Date   WBC 7.6 08/31/2018   HGB 13.9 08/31/2018   HCT 41.4 08/31/2018   MCV 84 08/31/2018   PLT 324 08/31/2018    Lab Results  Component Value Date   CREATININE 0.88 08/31/2018   BUN 8 08/31/2018   NA 142 08/31/2018   K 4.7 08/31/2018   CL 104 08/31/2018   CO2 23 08/31/2018    Lab Results  Component Value Date   ALT 21 08/31/2018   AST 20 08/31/2018   ALKPHOS 75 08/31/2018   BILITOT 0.3 08/31/2018    Lab Results  Component Value Date   TSH 2.450 08/31/2018    Lab Results  Component Value Date   CHOL 266 (H) 08/31/2018   HDL 49 08/31/2018   LDLCALC 201 (H) 08/31/2018   TRIG 78 08/31/2018   CHOLHDL 5.4 (H) 08/31/2018     Assessment:   1. Encounter for well woman exam with routine gynecological exam   2. Breast cancer screening by mammogram   3. Obesity (BMI 35.0-39.9 without comorbidity)   4. Gastroesophageal reflux disease with esophagitis, unspecified whether hemorrhage   5. Mild exercise-induced asthma   6.  Menorrhagia with regular cycle   7. History of vitamin D deficiency   8. Dyslipidemia   9. Encounter for surveillance of contraceptive pills    Plan:    -  Blood tests: CBC with diff, Comprehensive metabolic panel, Lipoproteins and Vitamin D. - Breast self exam technique reviewed and patient encouraged to perform self-exam monthly. - Contraception: OCP (estrogen/progesterone). Currently on continuous low-dose OCPs which are managing her menorrhagia. Notes resumption of heavy cycles if she forgets to take a dose. Ok to continue use despite age > 42 as no other risk factors and is on low dosing.  - Discussed healthy lifestyle modifications. - Mammogram ordered. - Pap smear up to date.  Patient desires to continue with q 5 year pap smear screens.  Also would like to do q other year pelvic exams. Pelvic exam deferred today. Ok to perform as patient with no prior history of abnormal pap smears, no other risk factors currently.  - Mild dyslipidemia present - Declines flu vaccine.  - Refills given on all medications (OCPs, albuterol inhaler, and reflux medication).  - Return to clinic in 1 year for annual exam.    Hildred Laser, MD [facility]

## 2019-10-06 LAB — CBC
Hematocrit: 41.4 % (ref 34.0–46.6)
Hemoglobin: 13.9 g/dL (ref 11.1–15.9)
MCH: 29.4 pg (ref 26.6–33.0)
MCHC: 33.6 g/dL (ref 31.5–35.7)
MCV: 88 fL (ref 79–97)
Platelets: 302 10*3/uL (ref 150–450)
RBC: 4.73 x10E6/uL (ref 3.77–5.28)
RDW: 11.8 % (ref 11.7–15.4)
WBC: 7.9 10*3/uL (ref 3.4–10.8)

## 2019-10-06 LAB — COMPREHENSIVE METABOLIC PANEL
ALT: 25 IU/L (ref 0–32)
AST: 20 IU/L (ref 0–40)
Albumin/Globulin Ratio: 1.6 (ref 1.2–2.2)
Albumin: 4.6 g/dL (ref 3.8–4.8)
Alkaline Phosphatase: 63 IU/L (ref 39–117)
BUN/Creatinine Ratio: 12 (ref 9–23)
BUN: 9 mg/dL (ref 6–24)
Bilirubin Total: 0.6 mg/dL (ref 0.0–1.2)
CO2: 25 mmol/L (ref 20–29)
Calcium: 9.5 mg/dL (ref 8.7–10.2)
Chloride: 104 mmol/L (ref 96–106)
Creatinine, Ser: 0.77 mg/dL (ref 0.57–1.00)
GFR calc Af Amer: 106 mL/min/{1.73_m2} (ref >59)
GFR calc non Af Amer: 92 mL/min/{1.73_m2} (ref >59)
Globulin, Total: 2.8 g/dL (ref 1.5–4.5)
Glucose: 84 mg/dL (ref 65–99)
Potassium: 4.4 mmol/L (ref 3.5–5.2)
Sodium: 142 mmol/L (ref 134–144)
Total Protein: 7.4 g/dL (ref 6.0–8.5)

## 2019-10-06 LAB — LIPID PANEL
Chol/HDL Ratio: 6.3 ratio — ABNORMAL HIGH (ref 0.0–4.4)
Cholesterol, Total: 323 mg/dL — ABNORMAL HIGH (ref 100–199)
HDL: 51 mg/dL (ref >39)
LDL Chol Calc (NIH): 269 mg/dL — ABNORMAL HIGH (ref 0–99)
Triglycerides: 42 mg/dL (ref 0–149)
VLDL Cholesterol Cal: 3 mg/dL — ABNORMAL LOW (ref 5–40)

## 2019-10-06 LAB — VITAMIN D 25 HYDROXY (VIT D DEFICIENCY, FRACTURES): Vit D, 25-Hydroxy: 33.8 ng/mL (ref 30.0–100.0)

## 2019-10-12 DIAGNOSIS — B001 Herpesviral vesicular dermatitis: Secondary | ICD-10-CM

## 2019-10-24 ENCOUNTER — Other Ambulatory Visit: Payer: Self-pay

## 2019-10-24 MED ORDER — ACYCLOVIR 5 % EX CREA: 1.0000 "application " | TOPICAL_CREAM | Freq: Every day | CUTANEOUS | 0 refills | Status: AC

## 2019-10-27 NOTE — Telephone Encounter (Signed)
The pt called in and stated that she got a refill for her acyclovir cream (ZOVIRAX) 5 %. But when she went to the pharmacy it was $500. The pt is requesting something generic. The pts pharmacy is [facility] in [place]. Please advise

## 2019-10-28 ENCOUNTER — Telehealth: Payer: Self-pay | Admitting: Certified Nurse Midwife

## 2019-10-28 NOTE — Telephone Encounter (Signed)
Patient called wanting to follow up on previous messages. I made her aware Dr. Valentino Saxon is not here today but that I would check in with you for any updates.

## 2019-11-01 MED ORDER — ACYCLOVIR 5 % EX CREA: 1.0000 "application " | TOPICAL_CREAM | CUTANEOUS | 0 refills | Status: DC

## 2019-12-23 ENCOUNTER — Other Ambulatory Visit: Payer: Self-pay

## 2019-12-23 ENCOUNTER — Other Ambulatory Visit: Payer: 59

## 2019-12-23 DIAGNOSIS — E785 Hyperlipidemia, unspecified: Secondary | ICD-10-CM

## 2019-12-24 LAB — LIPID PANEL
Chol/HDL Ratio: 5.1 ratio — ABNORMAL HIGH (ref 0.0–4.4)
Cholesterol, Total: 265 mg/dL — ABNORMAL HIGH (ref 100–199)
HDL: 52 mg/dL (ref >39)
LDL Chol Calc (NIH): 207 mg/dL — ABNORMAL HIGH (ref 0–99)
Triglycerides: 45 mg/dL (ref 0–149)
VLDL Cholesterol Cal: 6 mg/dL (ref 5–40)

## 2020-01-02 MED ORDER — ROSUVASTATIN CALCIUM 10 MG PO TABS: 10.0000 mg | ORAL_TABLET | Freq: Every day | ORAL | 3 refills | Status: DC

## 2020-04-18 ENCOUNTER — Other Ambulatory Visit: Payer: Self-pay | Admitting: Obstetrics and Gynecology

## 2020-04-18 DIAGNOSIS — Z1231 Encounter for screening mammogram for malignant neoplasm of breast: Secondary | ICD-10-CM

## 2020-04-24 ENCOUNTER — Ambulatory Visit: Payer: 59

## 2020-04-25 ENCOUNTER — Other Ambulatory Visit: Payer: Self-pay

## 2020-04-25 ENCOUNTER — Ambulatory Visit
Admission: RE | Admit: 2020-04-25 | Discharge: 2020-04-25 | Disposition: A | Discharge status: Home / Self Care | Payer: 59 | Source: Ambulatory Visit | Attending: Obstetrics and Gynecology | Admitting: Obstetrics and Gynecology

## 2020-04-25 DIAGNOSIS — Z1231 Encounter for screening mammogram for malignant neoplasm of breast: Secondary | ICD-10-CM | POA: Diagnosis not present

## 2020-04-26 ENCOUNTER — Inpatient Hospital Stay: Admission: RE | Admit: 2020-04-26 | Payer: 59 | Source: Ambulatory Visit

## 2020-08-23 ENCOUNTER — Other Ambulatory Visit: Payer: Self-pay

## 2020-08-23 ENCOUNTER — Ambulatory Visit
Admission: EM | Admit: 2020-08-23 | Discharge: 2020-08-23 | Disposition: A | Discharge status: Home / Self Care | Payer: 59 | Attending: Family Medicine | Admitting: Family Medicine

## 2020-08-23 DIAGNOSIS — S91332A Puncture wound without foreign body, left foot, initial encounter: Secondary | ICD-10-CM | POA: Diagnosis not present

## 2020-08-23 DIAGNOSIS — Z23 Encounter for immunization: Secondary | ICD-10-CM

## 2020-08-23 MED ORDER — TETANUS-DIPHTH-ACELL PERTUSSIS 5-2.5-18.5 LF-MCG/0.5 IM SUSY
0.5000 mL | PREFILLED_SYRINGE | Freq: Once | INTRAMUSCULAR | Status: AC
  Administered 2020-08-23: 0.5 mL via INTRAMUSCULAR

## 2020-08-23 MED ORDER — LEVOFLOXACIN 750 MG PO TABS: 750.0000 mg | ORAL_TABLET | Freq: Every day | ORAL | 0 refills | Status: DC

## 2020-08-23 NOTE — ED Triage Notes (Signed)
Pt c/o stepping on nail earlier today. She was wearing shoes. She does have a puncture wound to the underside of her left foot. She states her 2nd toe has started swelling and the foot is painful when walking. She does not remember her last Tetanus shot.

## 2020-08-23 NOTE — Discharge Instructions (Signed)
Antibiotic as prescribed.  Call with concerns.  Take care  Dr. Jakira Mcfadden  

## 2020-08-23 NOTE — ED Provider Notes (Signed)
[facility]    CSN: [id number] Arrival date & time: 08/23/20  1826  History   Chief Complaint Puncture wound  HPI  50 year old female presents with the above complaint.  Patient reports that she stepped on a nail earlier today.  She was wearing shoes.  It punctured her she went through to her foot.  The puncture wound is located at the second MTP joint on the plantar aspect.  She has associated redness and swelling.  Pain currently 8/10 in severity.  Unknown last tetanus.  Is in need of tetanus today.  No other reported symptoms.  No other complaints.  Past Medical History:  Diagnosis Date  . Cyst of ovary   . Elevated cholesterol   . Heavy menstrual bleeding   . Helicobacter positive gastritis 06/22/2017  . History of vitamin D deficiency 10/05/2019  . Menorrhagia 10/05/2019  . Mild exercise-induced asthma 10/05/2019  . Obesity (BMI 35.0-39.9 without comorbidity) 10/05/2019  . Peptic ulcer     Patient Active Problem List   Diagnosis Date Noted  . Mild exercise-induced asthma 10/05/2019  . Menorrhagia 10/05/2019  . History of vitamin D deficiency 10/05/2019  . Dyslipidemia 10/05/2019  . Gastroesophageal reflux disease with esophagitis 10/05/2019  . Obesity (BMI 35.0-39.9 without comorbidity) 10/05/2019  . Helicobacter positive gastritis 06/22/2017  . Abdominal pain 05/23/2017  . Vitamin D deficiency 07/22/2016    Past Surgical History:  Procedure Laterality Date  . CESAREAN SECTION  [id number]  . OOPHORECTOMY Right 2011  . TUBAL LIGATION  2011    OB History    Gravida  2   Para      Term      Preterm      AB      Living        SAB      IAB      Ectopic      Multiple      Live Births               Home Medications    Prior to Admission medications   Medication Sig Start Date End Date Taking? Authorizing Provider  albuterol (VENTOLIN HFA) 108 (90 Base) MCG/ACT inhaler Inhale 1-2 puffs into the lungs every 6 (six) hours as needed  for wheezing or shortness of breath. 10/05/19  Yes Hildred Laser, MD  levofloxacin (LEVAQUIN) 750 MG tablet Take 1 tablet (750 mg total) by mouth daily. 08/23/20  Yes Jenetta Wease G, DO  Norethindrone-Ethinyl Estradiol-Fe Biphas (LO LOESTRIN FE) 1 MG-10 MCG / 10 MCG tablet Take 1 tablet by mouth daily. Takes continuously, omitting placebo pills 10/05/19  Yes Hildred Laser, MD  pantoprazole (PROTONIX) 40 MG tablet Take 1 tablet (40 mg total) by mouth 1 day or 1 dose. Please tell patient that her PCP will be the one to refill her prescriptions after this. Thank you. 10/05/19  Yes Hildred Laser, MD  rosuvastatin (CRESTOR) 10 MG tablet Take 1 tablet (10 mg total) by mouth daily. 01/02/20  Yes Hildred Laser, MD    Family History Family History  Problem Relation Age of Onset  . Heart disease Maternal Grandmother   . Alzheimer's disease Mother   . Cirrhosis Father        non-alcoholic  . Breast cancer Neg Hx     Social History Social History   Tobacco Use  . Smoking status: Never Smoker  . Smokeless tobacco: Never Used  Vaping Use  . Vaping Use: Never used  Substance Use Topics  .  Alcohol use: No  . Drug use: No     Allergies   Macrobid [nitrofurantoin monohyd macro], Nitrofurantoin, Ciprofloxacin, and Penicillins   Review of Systems Review of Systems  Constitutional: Negative.   Skin: Positive for wound.   Physical Exam Triage Vital Signs ED Triage Vitals  Enc Vitals Group     BP 08/23/20 1835 (!) 153/95     Pulse Rate 08/23/20 1835 93     Resp 08/23/20 1835 18     Temp 08/23/20 1835 98.3 F (36.8 C)     Temp Source 08/23/20 1835 Oral     SpO2 08/23/20 1835 99 %     Weight 08/23/20 1833 158 lb (71.7 kg)     Height 08/23/20 1833 5\' 1"  (1.549 m)     Head Circumference --      Peak Flow --      Pain Score 08/23/20 1832 8     Pain Loc --      Pain Edu? --      Excl. in GC? --    Updated Vital Signs BP (!) 153/95 (BP Location: Left Arm)   Pulse 93   Temp 98.3 F (36.8 C)  (Oral)   Resp 18   Ht 5\' 1"  (1.549 m)   Wt 71.7 kg   SpO2 99%   BMI 29.85 kg/m   Visual Acuity Right Eye Distance:   Left Eye Distance:   Bilateral Distance:    Right Eye Near:   Left Eye Near:    Bilateral Near:     Physical Exam Vitals and nursing note reviewed.  Constitutional:      General: She is not in acute distress.    Appearance: Normal appearance. She is not ill-appearing.  HENT:     Head: Normocephalic and atraumatic.  Pulmonary:     Effort: Pulmonary effort is normal. No respiratory distress.  Musculoskeletal:       Feet:  Feet:     Comments: Puncture wound and mild surrounding edema and erythema noted at the labeled location. Neurological:     Mental Status: She is alert.  Psychiatric:        Mood and Affect: Mood normal.        Behavior: Behavior normal.    UC Treatments / Results  Labs (all labs ordered are listed, but only abnormal results are displayed) Labs Reviewed - No data to display  EKG   Radiology No results found.  Procedures Procedures (including critical care time)  Medications Ordered in UC Medications  Tdap (BOOSTRIX) injection 0.5 mL (0.5 mLs Intramuscular Given 08/23/20 1858)    Initial Impression / Assessment and Plan / UC Course  I have reviewed the triage vital signs and the nursing notes.  Pertinent labs & imaging results that were available during my care of the patient were reviewed by me and considered in my medical decision making (see chart for details).    50 year old female presents with a plantar puncture wound.  Discussed prophylactic antibiotics.  Patient wished to proceed with this.  Levaquin as prescribed.  Tetanus given today.  Final Clinical Impressions(s) / UC Diagnoses   Final diagnoses:  Puncture wound of plantar aspect of left foot, initial encounter     Discharge Instructions     Antibiotic as prescribed.  Call with concerns.  Take care  Dr. 10/21/20    ED Prescriptions    Medication  Sig Dispense Auth. Provider   levofloxacin (LEVAQUIN) 750 MG tablet Take 1 tablet (750 mg  total) by mouth daily. 5 tablet Tommie Sams, DO     PDMP not reviewed this encounter.   Tommie Sams, [place] 08/23/20 1933

## 2020-10-11 ENCOUNTER — Encounter: Payer: 59 | Admitting: Obstetrics and Gynecology

## 2020-11-23 ENCOUNTER — Encounter: Payer: 59 | Admitting: Obstetrics and Gynecology

## 2020-11-28 ENCOUNTER — Other Ambulatory Visit: Payer: Self-pay | Admitting: Obstetrics and Gynecology

## 2020-12-04 ENCOUNTER — Encounter: Payer: 59 | Admitting: Obstetrics and Gynecology

## 2020-12-28 ENCOUNTER — Other Ambulatory Visit: Payer: Self-pay | Admitting: Obstetrics and Gynecology

## 2021-01-01 ENCOUNTER — Telehealth: Payer: Self-pay | Admitting: Obstetrics and Gynecology

## 2021-01-01 MED ORDER — LO LOESTRIN FE 1 MG-10 MCG / 10 MCG PO TABS: 1.0000 | ORAL_TABLET | Freq: Every day | ORAL | 1 refills | Status: DC

## 2021-01-01 NOTE — Telephone Encounter (Signed)
Pt called no answer LM via VM that I had refilled her medication for another month and encouraged pt to keep her annual exam in July due to she had missed and canceled several appointment and not sure if [facility] would continue to refill her medication without seeing her.

## 2021-01-01 NOTE — Telephone Encounter (Signed)
Misty Welch called in and states her pharmacy let her know she doesn't have anymore refills on her Lo Loestrin Fe birth control pills.  Misty Welch is not scheduled for her physical until July 29th.  Misty Welch wants to know if she can either get some called in to her pharmacy, or can she pick up some samples today.  Patient states she has to take this type of medication continually and she is going out of town so she needs it today if at all possible.  Please advise.

## 2021-01-10 NOTE — Telephone Encounter (Signed)
Pt has an appt.

## 2021-02-12 ENCOUNTER — Ambulatory Visit (INDEPENDENT_AMBULATORY_CARE_PROVIDER_SITE_OTHER): Payer: 59 | Admitting: Obstetrics and Gynecology

## 2021-02-12 ENCOUNTER — Encounter: Payer: Self-pay | Admitting: Obstetrics and Gynecology

## 2021-02-12 ENCOUNTER — Other Ambulatory Visit: Payer: Self-pay

## 2021-02-12 VITALS — BP 128/87 | HR 92 | Ht 61.0 in | Wt 167.9 lb

## 2021-02-12 DIAGNOSIS — Z8742 Personal history of other diseases of the female genital tract: Secondary | ICD-10-CM

## 2021-02-12 DIAGNOSIS — Z1231 Encounter for screening mammogram for malignant neoplasm of breast: Secondary | ICD-10-CM

## 2021-02-12 DIAGNOSIS — E785 Hyperlipidemia, unspecified: Secondary | ICD-10-CM

## 2021-02-12 DIAGNOSIS — Z01419 Encounter for gynecological examination (general) (routine) without abnormal findings: Secondary | ICD-10-CM

## 2021-02-12 DIAGNOSIS — Z8639 Personal history of other endocrine, nutritional and metabolic disease: Secondary | ICD-10-CM

## 2021-02-12 DIAGNOSIS — Z1159 Encounter for screening for other viral diseases: Secondary | ICD-10-CM | POA: Diagnosis not present

## 2021-02-12 DIAGNOSIS — N951 Menopausal and female climacteric states: Secondary | ICD-10-CM

## 2021-02-12 NOTE — Progress Notes (Signed)
Patient overall doing well but notes hot flushes and possible menopausal symptoms. Has not taken birth control in the past month.    Shawn Route RN [facility]

## 2021-02-12 NOTE — Patient Instructions (Addendum)
Breast Self-Awareness Breast self-awareness is knowing how your breasts look and feel. Doing breast self-awareness is important. It allows you to catch a breast problem early while it is still small and can be treated. All women should do breast self-awareness, including women who have had breast implants. Tell your doctorif you notice a change in your breasts. What you need: A mirror. A well-lit room. How to do a breast self-exam A breast self-exam is one way to learn what is normal for your breasts and tocheck for changes. To do a breast self-exam: Look for changes  Take off all the clothes above your waist. Stand in front of a mirror in a room with good lighting. Put your hands on your hips. Push your hands down. Look at your breasts and nipples in the mirror to see if one breast or nipple looks different from the other. Check to see if: The shape of one breast is different. The size of one breast is different. There are wrinkles, dips, and bumps in one breast and not the other. Look at each breast for changes in the skin, such as: Redness. Scaly areas. Look for changes in your nipples, such as: Liquid around the nipples. Bleeding. Dimpling. Redness. A change in where the nipples are.  Feel for changes  Lie on your back on the floor. Feel each breast. To do this, follow these steps: Pick a breast to feel. Put the arm closest to that breast above your head. Use your other arm to feel the nipple area of your breast. Feel the area with the pads of your three middle fingers by making small circles with your fingers. For the first circle, press lightly. For the second circle, press harder. For the third circle, press even harder. Keep making circles with your fingers at the different pressures as you move down your breast. Stop when you feel your ribs. Move your fingers a little toward the center of your body. Start making circles with your fingers again, this time going up until  you reach your collarbone. Keep making up-and-down circles until you reach your armpit. Remember to keep using the three pressures. Feel the other breast in the same way. Sit or stand in the tub or shower. With soapy water on your skin, feel each breast the same way you did in step 2 when you were lying on the floor.  Write down what you find Writing down what you find can help you remember what to tell your doctor. Write down: What is normal for each breast. Any changes you find in each breast, including: The kind of changes you find. Whether you have pain. Size and location of any lumps. When you last had your menstrual period. General tips Check your breasts every month. If you are breastfeeding, the best time to check your breasts is after you feed your baby or after you use a breast pump. If you get menstrual periods, the best time to check your breasts is 5-7 days after your menstrual period is over. With time, you will become comfortable with the self-exam, and you will begin to know if there are changes in your breasts. Contact a doctor if you: See a change in the shape or size of your breasts or nipples. See a change in the skin of your breast or nipples, such as red or scaly skin. Have fluid coming from your nipples that is not normal. Find a lump or thick area that was not there before. Have pain in   your breasts. Have any concerns about your breast health. Summary Breast self-awareness includes looking for changes in your breasts, as well as feeling for changes within your breasts. Breast self-awareness should be done in front of a mirror in a well-lit room. You should check your breasts every month. If you get menstrual periods, the best time to check your breasts is 5-7 days after your menstrual period is over. Let your doctor know of any changes you see in your breasts, including changes in size, changes on the skin, pain or tenderness, or fluid from your nipples that is  not normal. This information is not intended to replace advice given to you by your health care provider. Make sure you discuss any questions you have with your healthcare provider. Document Revised: 02/23/2018 Document Reviewed: 02/23/2018 Elsevier Patient Education  2022 Elsevier Inc.     Preventive Care 27-71 Years Old, Female Preventive care refers to lifestyle choices and visits with your health care provider that can promote health and wellness. This includes: A yearly physical exam. This is also called an annual wellness visit. Regular dental and eye exams. Immunizations. Screening for certain conditions. Healthy lifestyle choices, such as: Eating a healthy diet. Getting regular exercise. Not using drugs or products that contain nicotine and tobacco. Limiting alcohol use. What can I expect for my preventive care visit? Physical exam Your health care provider will check your: Height and weight. These may be used to calculate your BMI (body mass index). BMI is a measurement that tells if you are at a healthy weight. Heart rate and blood pressure. Body temperature. Skin for abnormal spots. Counseling Your health care provider may ask you questions about your: Past medical problems. Family's medical history. Alcohol, tobacco, and drug use. Emotional well-being. Home life and relationship well-being. Sexual activity. Diet, exercise, and sleep habits. Work and work Statistician. Access to firearms. Method of birth control. Menstrual cycle. Pregnancy history. What immunizations do I need?  Vaccines are usually given at various ages, according to a schedule. Your health care provider will recommend vaccines for you based on your age, medicalhistory, and lifestyle or other factors, such as travel or where you work. What tests do I need? Blood tests Lipid and cholesterol levels. These may be checked every 5 years, or more often if you are over 32 years old. Hepatitis C  test. Hepatitis B test. Screening Lung cancer screening. You may have this screening every year starting at age 53 if you have a 30-pack-year history of smoking and currently smoke or have quit within the past 15 years. Colorectal cancer screening. All adults should have this screening starting at age 19 and continuing until age 15. Your health care provider may recommend screening at age 46 if you are at increased risk. You will have tests every 1-10 years, depending on your results and the type of screening test. Diabetes screening. This is done by checking your blood sugar (glucose) after you have not eaten for a while (fasting). You may have this done every 1-3 years. Mammogram. This may be done every 1-2 years. Talk with your health care provider about when you should start having regular mammograms. This may depend on whether you have a family history of breast cancer. BRCA-related cancer screening. This may be done if you have a family history of breast, ovarian, tubal, or peritoneal cancers. Pelvic exam and Pap test. This may be done every 3 years starting at age 42. Starting at age 15, this may be done every  5 years if you have a Pap test in combination with an HPV test. Other tests STD (sexually transmitted disease) testing, if you are at risk. Bone density scan. This is done to screen for osteoporosis. You may have this scan if you are at high risk for osteoporosis. Talk with your health care provider about your test results, treatment options,and if necessary, the need for more tests. Follow these instructions at home: Eating and drinking  Eat a diet that includes fresh fruits and vegetables, whole grains, lean protein, and low-fat dairy products. Take vitamin and mineral supplements as recommended by your health care provider. Do not drink alcohol if: Your health care provider tells you not to drink. You are pregnant, may be pregnant, or are planning to become pregnant. If  you drink alcohol: Limit how much you have to 0-1 drink a day. Be aware of how much alcohol is in your drink. In the [place] one drink equals one 12 oz bottle of beer (355 mL), one 5 oz glass of wine (148 mL), or one 1 oz glass of hard liquor (44 mL).  Lifestyle Take daily care of your teeth and gums. Brush your teeth every morning and night with fluoride toothpaste. Floss one time each day. Stay active. Exercise for at least 30 minutes 5 or more days each week. Do not use any products that contain nicotine or tobacco, such as cigarettes, e-cigarettes, and chewing tobacco. If you need help quitting, ask your health care provider. Do not use drugs. If you are sexually active, practice safe sex. Use a condom or other form of protection to prevent STIs (sexually transmitted infections). If you do not wish to become pregnant, use a form of birth control. If you plan to become pregnant, see your health care provider for a prepregnancy visit. If told by your health care provider, take low-dose aspirin daily starting at age 50. Find healthy ways to cope with stress, such as: Meditation, yoga, or listening to music. Journaling. Talking to a trusted person. Spending time with friends and family. Safety Always wear your seat belt while driving or riding in a vehicle. Do not drive: If you have been drinking alcohol. Do not ride with someone who has been drinking. When you are tired or distracted. While texting. Wear a helmet and other protective equipment during sports activities. If you have firearms in your house, make sure you follow all gun safety procedures. What's next? Visit your health care provider once a year for an annual wellness visit. Ask your health care provider how often you should have your eyes and teeth checked. Stay up to date on all vaccines. This information is not intended to replace advice given to you by your health care provider. Make sure you discuss any questions you  have with your healthcare provider. Document Revised: 04/10/2020 Document Reviewed: 03/18/2018 Elsevier Patient Education  2022 Elsevier Inc.  

## 2021-02-12 NOTE — Progress Notes (Signed)
GYNECOLOGY ANNUAL PHYSICAL EXAM PROGRESS NOTE  Subjective:    Misty Welch is a 50 y.o. G2P0 married female who presents for an annual exam.  Desires refills on several of her medications. The patient is sexually active. The patient wears seatbelts: yes. The patient participates in regular exercise: yes. Has the patient ever been transfused or tattooed?: no. The patient reports that there is not domestic violence in her life.   The patient has the following complaints today. Patient wonders how far into menopause she may be.  Has been using continuous low dose OCPs for management of her cycles and anemia for several years.  Notes that earlier last  month she forgot her prescription while on vacation for a week, and realized that she did not have any breakthrough bleeding.  After getting new prescription filled, she notes that she was forgetful of her pill some days. Reports last time that she took a pill was around July 4th. Still has not had any breakthrough bleeding.  She is however noting some hot flushes, memory fog, and  mood changes. Notes her previous provider advised her to stay on her pills until she reached ~ 50 years of age, but wonders if she can stop sooner based on symptoms.   Gynecologic History No LMP recorded (lmp unknown). (Menstrual status: Oral contraceptives). Contraception: OCP (estrogen/progesterone) - LoLoestrin, on continuous regimen due to history of menorrhagia and anemia History of STI's: Deneis Last Pap: 08/31/2018. Results were: normal.  Denies h/o abnormal pap smears. Last mammogram: 04/25/2020. Results were: normal.  Previous mammogram in 2021 abnormal - possible left breast asymmetry (BIRADS 0).  F/u left breast diagnostic mammo normal.    OB History  Gravida Para Term Preterm AB Living  2 0 0 0 0 0  SAB IAB Ectopic Multiple Live Births  0 0 0 0 0    # Outcome Date GA Lbr Len/2nd Weight Sex Delivery Anes PTL Lv  2 Gravida 2006    F CS-Unspec     1  Gravida 2003    M CS-Unspec       Past Medical History:  Diagnosis Date   Cyst of ovary    Elevated cholesterol    Heavy menstrual bleeding    Helicobacter positive gastritis 06/22/2017   History of vitamin D deficiency 10/05/2019   Menorrhagia 10/05/2019   Mild exercise-induced asthma 10/05/2019   Obesity (BMI 35.0-39.9 without comorbidity) 10/05/2019   Peptic ulcer     Past Surgical History:  Procedure Laterality Date   CESAREAN SECTION  2003,2006   OOPHORECTOMY Right 2011   TUBAL LIGATION  2011    Family History  Problem Relation Age of Onset   Heart disease Maternal Grandmother    Alzheimer's disease Mother    Cirrhosis Father        non-alcoholic   Breast cancer Neg Hx     Social History   Socioeconomic History   Marital status: Married    Spouse name: Not on file   Number of children: Not on file   Years of education: Not on file   Highest education level: Not on file  Occupational History   Not on file  Tobacco Use   Smoking status: Never   Smokeless tobacco: Never  Vaping Use   Vaping Use: Never used  Substance and Sexual Activity   Alcohol use: No   Drug use: No   Sexual activity: Yes    Birth control/protection: Surgical  Other Topics Concern  Not on file  Social History Narrative   Not on file   Social Determinants of Health   Financial Resource Strain: Not on file  Food Insecurity: Not on file  Transportation Needs: Not on file  Physical Activity: Not on file  Stress: Not on file  Social Connections: Not on file  Intimate Partner Violence: Not on file    Current Outpatient Medications on File Prior to Visit  Medication Sig Dispense Refill   albuterol (VENTOLIN HFA) 108 (90 Base) MCG/ACT inhaler Inhale 1-2 puffs into the lungs every 6 (six) hours as needed for wheezing or shortness of breath. 8 g 3   rosuvastatin (CRESTOR) 10 MG tablet Take 1 tablet (10 mg total) by mouth daily. 90 tablet 3   Norethindrone-Ethinyl Estradiol-Fe Biphas (LO  LOESTRIN FE) 1 MG-10 MCG / 10 MCG tablet Take 1 tablet by mouth daily. Takes continuously, omitting placebo pills (Patient not taking: Reported on 02/12/2021) 28 tablet 1   No current facility-administered medications on file prior to visit.    Allergies  Allergen Reactions   Macrobid [Nitrofurantoin Monohyd Macro] Anaphylaxis   Nitrofurantoin Anaphylaxis   Ciprofloxacin     Dizzy spells and feeling emotional    Penicillins     Childhood reaction      Review of Systems Constitutional: negative for chills, fatigue, fevers and sweats Eyes: negative for irritation, redness and visual disturbance Ears, nose, mouth, throat, and face: negative for hearing loss, nasal congestion, snoring and tinnitus Respiratory: negative for asthma, cough, sputum Cardiovascular: negative for chest pain, dyspnea, exertional chest pressure/discomfort, irregular heart beat, palpitations and syncope Gastrointestinal: negative for abdominal pain, change in bowel habits, nausea and vomiting Genitourinary: negative for abnormal menstrual periods, genital lesions, sexual problems and vaginal discharge, dysuria and urinary incontinence Integument/breast: negative for breast lump, breast tenderness and nipple discharge Hematologic/lymphatic: negative for bleeding and easy bruising Musculoskeletal:negative for back pain and muscle weakness Neurological: negative for dizziness, headaches, vertigo and weakness Endocrine: negative for diabetic symptoms including polydipsia, polyuria and skin dryness.   Allergic/Immunologic: negative for hay fever and urticaria        Objective:  Blood pressure 128/87, pulse 92, height 5\' 1"  (1.549 m), weight 167 lb 14.4 oz (76.2 kg). Body mass index is 31.72 kg/m.  General Appearance:    Alert, cooperative, no distress, appears stated age  Head:    Normocephalic, without obvious abnormality, atraumatic  Eyes:    PERRL, conjunctiva/corneas clear, EOM's intact, both eyes  Ears:     Normal external ear canals, both ears  Nose:   Nares normal, septum midline, mucosa normal, no drainage or sinus tenderness  Throat:   Lips, mucosa, and tongue normal; teeth and gums normal  Neck:   Supple, symmetrical, trachea midline, no adenopathy; thyroid: no enlargement/tenderness/nodules; no carotid bruit or JVD  Back:     Symmetric, no curvature, ROM normal, no CVA tenderness  Lungs:     Clear to auscultation bilaterally, respirations unlabored  Chest Wall:    No tenderness or deformity   Heart:    Regular rate and rhythm, S1 and S2 normal, no murmur, rub or gallop  Breast Exam:    No tenderness, masses, or nipple abnormality  Abdomen:     Soft, non-tender, bowel sounds active all four quadrants, no masses, no organomegaly.    Genitalia:    Deferred per patient request  Rectal:    Not examined.   Extremities:   Extremities normal, atraumatic, no cyanosis or edema  Pulses:  2+ and symmetric all extremities  Skin:   Skin color, texture, turgor normal, no rashes or lesions  Lymph nodes:   Cervical, supraclavicular, and axillary nodes normal  Neurologic:   CNII-XII intact, normal strength, sensation and reflexes throughout   .  Labs:  Lab Results  Component Value Date   WBC 7.9 10/05/2019   HGB 13.9 10/05/2019   HCT 41.4 10/05/2019   MCV 88 10/05/2019   PLT 302 10/05/2019    Lab Results  Component Value Date   CREATININE 0.77 10/05/2019   BUN 9 10/05/2019   NA 142 10/05/2019   K 4.4 10/05/2019   CL 104 10/05/2019   CO2 25 10/05/2019    Lab Results  Component Value Date   ALT 25 10/05/2019   AST 20 10/05/2019   ALKPHOS 63 10/05/2019   BILITOT 0.6 10/05/2019    Lab Results  Component Value Date   TSH 2.450 08/31/2018    Lab Results  Component Value Date   CHOL 265 (H) 12/23/2019   HDL 52 12/23/2019   LDLCALC 207 (H) 12/23/2019   TRIG 45 12/23/2019   CHOLHDL 5.1 (H) 12/23/2019     Assessment:   1. Encounter for well woman exam with routine  gynecological exam   2. Breast cancer screening by mammogram   3. Encounter for hepatitis C screening test for low risk patient   4. H/O amenorrhea   5. Dyslipidemia   6. History of vitamin D deficiency   7. Perimenopausal vasomotor symptoms    Plan:    -  Blood tests: CBC with diff, Comprehensive metabolic panel, Lipoproteins and Vitamin D.  Will also check hormone levels to assess menopause vs perimenopausal status.  - Breast self exam technique reviewed and patient encouraged to perform self-exam monthly. - Contraception: previously on OCPs, however has not utilized in the past month. Has not had any issues with bleeding so far. Discussed that she can remain off of medication for now, and if hormone levels notes she is in menopause, does not need to continue. Also reiterated non-hormonal (herbal remedies for use.  - Discussed healthy lifestyle modifications. - Mammogram ordered. - Pap smear up to date.  Patient desires to continue with q 5 year pap smear screens.  Also would like to do q other year pelvic exams. Pelvic exam performed today. Ok to perform as patient with no prior history of abnormal pap smears, no other risk factors currently.  - Mild dyslipidemia present, improved last year. Will follow up.  - Declines COVID vaccination.  - Return to clinic in 1 year for annual exam.    Hildred Laser, MD [facility]

## 2021-02-13 LAB — COMPREHENSIVE METABOLIC PANEL
ALT: 15 IU/L (ref 0–32)
AST: 19 IU/L (ref 0–40)
Albumin/Globulin Ratio: 2 (ref 1.2–2.2)
Albumin: 4.8 g/dL (ref 3.8–4.8)
Alkaline Phosphatase: 60 IU/L (ref 44–121)
BUN/Creatinine Ratio: 8 — ABNORMAL LOW (ref 9–23)
BUN: 6 mg/dL (ref 6–24)
Bilirubin Total: 0.4 mg/dL (ref 0.0–1.2)
CO2: 24 mmol/L (ref 20–29)
Calcium: 9.5 mg/dL (ref 8.7–10.2)
Chloride: 102 mmol/L (ref 96–106)
Creatinine, Ser: 0.72 mg/dL (ref 0.57–1.00)
Globulin, Total: 2.4 g/dL (ref 1.5–4.5)
Glucose: 82 mg/dL (ref 65–99)
Potassium: 4.4 mmol/L (ref 3.5–5.2)
Sodium: 141 mmol/L (ref 134–144)
Total Protein: 7.2 g/dL (ref 6.0–8.5)
eGFR: 103 mL/min/{1.73_m2} (ref >59)

## 2021-02-13 LAB — FSH/LH
FSH: 87.7 m[IU]/mL
LH: 35.4 m[IU]/mL

## 2021-02-13 LAB — CBC
Hematocrit: 41.9 % (ref 34.0–46.6)
Hemoglobin: 14.2 g/dL (ref 11.1–15.9)
MCH: 29.4 pg (ref 26.6–33.0)
MCHC: 33.9 g/dL (ref 31.5–35.7)
MCV: 87 fL (ref 79–97)
Platelets: 283 10*3/uL (ref 150–450)
RBC: 4.83 x10E6/uL (ref 3.77–5.28)
RDW: 12 % (ref 11.7–15.4)
WBC: 7.8 10*3/uL (ref 3.4–10.8)

## 2021-02-13 LAB — LIPID PANEL
Chol/HDL Ratio: 5.8 ratio — ABNORMAL HIGH (ref 0.0–4.4)
Cholesterol, Total: 369 mg/dL — ABNORMAL HIGH (ref 100–199)
HDL: 64 mg/dL (ref >39)
LDL Chol Calc (NIH): 287 mg/dL — ABNORMAL HIGH (ref 0–99)
Triglycerides: 106 mg/dL (ref 0–149)
VLDL Cholesterol Cal: 18 mg/dL (ref 5–40)

## 2021-02-13 LAB — PROGESTERONE: Progesterone: 0.2 ng/mL

## 2021-02-13 LAB — VITAMIN D 25 HYDROXY (VIT D DEFICIENCY, FRACTURES): Vit D, 25-Hydroxy: 39.2 ng/mL (ref 30.0–100.0)

## 2021-03-09 ENCOUNTER — Other Ambulatory Visit: Payer: Self-pay | Admitting: Obstetrics and Gynecology

## 2021-04-26 ENCOUNTER — Ambulatory Visit
Admission: RE | Admit: 2021-04-26 | Discharge: 2021-04-26 | Disposition: A | Discharge status: Home / Self Care | Payer: 59 | Source: Ambulatory Visit | Attending: Obstetrics and Gynecology | Admitting: Obstetrics and Gynecology

## 2021-04-26 ENCOUNTER — Other Ambulatory Visit: Payer: Self-pay

## 2021-04-26 DIAGNOSIS — Z01419 Encounter for gynecological examination (general) (routine) without abnormal findings: Secondary | ICD-10-CM | POA: Insufficient documentation

## 2021-04-26 DIAGNOSIS — Z1231 Encounter for screening mammogram for malignant neoplasm of breast: Secondary | ICD-10-CM | POA: Diagnosis not present

## 2021-11-05 ENCOUNTER — Telehealth: Payer: Self-pay | Admitting: Obstetrics and Gynecology

## 2021-11-05 NOTE — Telephone Encounter (Signed)
Called patient to inform her that prescription was written for a year. Should have enough medication until 03/09/2022 ?

## 2021-11-05 NOTE — Telephone Encounter (Signed)
Pt called to schedule appt for a physical today and stated that she also need a refill on her Crestor for her cholesterol. Please advise.  ?

## 2022-02-10 ENCOUNTER — Telehealth: Payer: Self-pay | Admitting: Obstetrics and Gynecology

## 2022-02-10 NOTE — Telephone Encounter (Signed)
Called to inform patient of advised information to double up on OCP's until she is seen. Patient states she has not taken the [facility] since last year. States she was seen last year and hadn't had a cycle in months. Pt states she received blood work that showed her "levels" was high and possibly in menopause. Pt states she has been having a cycle for the last 3 months but in this current cycle it has been an extended period of time with clots. Please advise.

## 2022-02-10 NOTE — Telephone Encounter (Signed)
Please inform that she can double up on her OCPs for the next few days (take 1 in a.m. and p.m.) until she sees me.

## 2022-02-10 NOTE — Telephone Encounter (Signed)
Please inform that we will recheck her levels when she comes.  I have seen the occasional patient who "jumped out of menopause" for a few months up to a year and then re-entered.  Also, we will work to get her scheduled for an ultrasound within the next week or so to make sure she has not developed a polyp or a fibroid that could also be causing her bleeding.

## 2022-02-10 NOTE — Telephone Encounter (Signed)
Patient called and states she has been having her menstrual cycle for about 17 days. Mensuration began on July 7th. Patient states flow has been heavy with clots. Pt states blood is red in color but has not stopped. Pt has physical appointment this coming Thursday. She is concerned and wanted to let provider know. She is seeking earlier appointment if possible. Informed patient of physical appointment opening tomorrow and could book her there but it would be before her yearly mark and may not be covered by insurance. Pt states to leave appointment for Thursday and if there is something that could be done before Thursday she would like to take that option.

## 2022-02-11 NOTE — Telephone Encounter (Signed)
Called and spoke with patient, Pt is aware of advised information. Agreed with getting [place]. Pt voiced understanding.

## 2022-02-12 ENCOUNTER — Telehealth: Payer: Self-pay | Admitting: Obstetrics and Gynecology

## 2022-02-12 NOTE — Telephone Encounter (Signed)
Pt called she has a physical; scheduled with cherry tomorrow 7/27 she is having excessive bleeding that has been present over 19 days. She is requesting an [place] apt- I have her scheduled for 8/1 she is needing order placed.

## 2022-02-13 ENCOUNTER — Encounter: Payer: Self-pay | Admitting: Obstetrics and Gynecology

## 2022-02-13 ENCOUNTER — Encounter: Payer: 59 | Admitting: Obstetrics and Gynecology

## 2022-02-13 ENCOUNTER — Ambulatory Visit (INDEPENDENT_AMBULATORY_CARE_PROVIDER_SITE_OTHER): Payer: 59 | Admitting: Obstetrics and Gynecology

## 2022-02-13 VITALS — BP 136/78 | HR 91 | Ht 61.0 in | Wt 175.0 lb

## 2022-02-13 DIAGNOSIS — E785 Hyperlipidemia, unspecified: Secondary | ICD-10-CM

## 2022-02-13 DIAGNOSIS — N950 Postmenopausal bleeding: Secondary | ICD-10-CM

## 2022-02-13 DIAGNOSIS — Z01419 Encounter for gynecological examination (general) (routine) without abnormal findings: Secondary | ICD-10-CM | POA: Diagnosis not present

## 2022-02-13 DIAGNOSIS — Z8742 Personal history of other diseases of the female genital tract: Secondary | ICD-10-CM

## 2022-02-13 DIAGNOSIS — Z1231 Encounter for screening mammogram for malignant neoplasm of breast: Secondary | ICD-10-CM

## 2022-02-13 NOTE — Progress Notes (Signed)
GYNECOLOGY ANNUAL PHYSICAL EXAM PROGRESS NOTE  Subjective:    Misty Welch is a 51 y.o. G2P0 married female who presents for an annual exam.  Desires refills on several of her medications. The patient is sexually active. The patient wears seatbelts: yes. The patient participates in regular exercise: yes. Has the patient ever been transfused or tattooed?: no. The patient reports that there is not domestic violence in her life.   The patient has the following complaints today. The patient has been bleeding vaginally for 21 days with passage of large clots. Had testing last year that said that she was in menopause so stopped her OCPs. Is feeling tired and drained. She had not had a cycle in 9 months.   Gynecologic History Patient's last menstrual period was 01/30/2022 (exact date). Contraception: post menopausal status? History of STI's: Deneis Last Pap: 08/31/2018. Results were: normal.  Denies h/o abnormal pap smears. Last mammogram: 04/26/2021. Results were: normal.  Previous mammogram in 2021 abnormal - possible left breast asymmetry (BIRADS 0).  F/u left breast diagnostic mammo normal.    OB History  Gravida Para Term Preterm AB Living  2 0 0 0 0 0  SAB IAB Ectopic Multiple Live Births  0 0 0 0 0    # Outcome Date GA Lbr Len/2nd Weight Sex Delivery Anes PTL Lv  2 Gravida 2006    F CS-Unspec     1 Gravida 2003    M CS-Unspec       Past Medical History:  Diagnosis Date   Cyst of ovary    Elevated cholesterol    Heavy menstrual bleeding    Helicobacter positive gastritis 06/22/2017   History of vitamin D deficiency 10/05/2019   Menorrhagia 10/05/2019   Mild exercise-induced asthma 10/05/2019   Obesity (BMI 35.0-39.9 without comorbidity) 10/05/2019   Peptic ulcer     Past Surgical History:  Procedure Laterality Date   CESAREAN SECTION  2003,2006   OOPHORECTOMY Right 2011   TUBAL LIGATION  2011    Family History  Problem Relation Age of Onset   Heart disease Maternal  Grandmother    Alzheimer's disease Mother    Cirrhosis Father        non-alcoholic   Breast cancer Neg Hx     Social History   Socioeconomic History   Marital status: Married    Spouse name: Not on file   Number of children: Not on file   Years of education: Not on file   Highest education level: Not on file  Occupational History   Not on file  Tobacco Use   Smoking status: Never   Smokeless tobacco: Never  Vaping Use   Vaping Use: Never used  Substance and Sexual Activity   Alcohol use: No   Drug use: No   Sexual activity: Yes    Birth control/protection: Surgical  Other Topics Concern   Not on file  Social History Narrative   Not on file   Social Determinants of Health   Financial Resource Strain: Not on file  Food Insecurity: Not on file  Transportation Needs: Not on file  Physical Activity: Not on file  Stress: Not on file  Social Connections: Not on file  Intimate Partner Violence: Not on file    Current Outpatient Medications on File Prior to Visit  Medication Sig Dispense Refill   albuterol (VENTOLIN HFA) 108 (90 Base) MCG/ACT inhaler Inhale 1-2 puffs into the lungs every 6 (six) hours as needed for wheezing  or shortness of breath. 8 g 3   Norethindrone-Ethinyl Estradiol-Fe Biphas (LO LOESTRIN FE) 1 MG-10 MCG / 10 MCG tablet Take 1 tablet by mouth daily. Takes continuously, omitting placebo pills (Patient not taking: Reported on 02/12/2021) 28 tablet 1   rosuvastatin (CRESTOR) 10 MG tablet TAKE 1 TABLET BY MOUTH EVERY DAY 90 tablet 3   No current facility-administered medications on file prior to visit.    Allergies  Allergen Reactions   Macrobid [Nitrofurantoin Monohyd Macro] Anaphylaxis   Nitrofurantoin Anaphylaxis   Ciprofloxacin     Dizzy spells and feeling emotional    Penicillins     Childhood reaction      Review of Systems Constitutional: negative for chills, fatigue, fevers and sweats Eyes: negative for irritation, redness and visual  disturbance Ears, nose, mouth, throat, and face: negative for hearing loss, nasal congestion, snoring and tinnitus Respiratory: negative for asthma, cough, sputum Cardiovascular: negative for chest pain, dyspnea, exertional chest pressure/discomfort, irregular heart beat, palpitations and syncope Gastrointestinal: negative for abdominal pain, change in bowel habits, nausea and vomiting Genitourinary: positive for abnormal uterine bleeding (see HPI).  Negative for genital lesions, sexual problems and vaginal discharge, dysuria and urinary incontinence Integument/breast: negative for breast lump, breast tenderness and nipple discharge Hematologic/lymphatic: negative for bleeding and easy bruising Musculoskeletal:negative for back pain and muscle weakness Neurological: negative for dizziness, headaches, vertigo and weakness Endocrine: negative for diabetic symptoms including polydipsia, polyuria and skin dryness.   Allergic/Immunologic: negative for hay fever and urticaria        Objective:  Blood pressure 136/78, pulse 91, height 5\' 1"  (1.549 m), weight 175 lb (79.4 kg), last menstrual period 01/30/2022. Body mass index is 33.07 kg/m.  General Appearance:    Alert, cooperative, no distress, appears stated age  Head:    Normocephalic, without obvious abnormality, atraumatic  Eyes:    PERRL, conjunctiva/corneas clear, EOM's intact, both eyes  Ears:    Normal external ear canals, both ears  Nose:   Nares normal, septum midline, mucosa normal, no drainage or sinus tenderness  Throat:   Lips, mucosa, and tongue normal; teeth and gums normal  Neck:   Supple, symmetrical, trachea midline, no adenopathy; thyroid: no enlargement/tenderness/nodules; no carotid bruit or JVD  Back:     Symmetric, no curvature, ROM normal, no CVA tenderness  Lungs:     Clear to auscultation bilaterally, respirations unlabored  Chest Wall:    No tenderness or deformity   Heart:    Regular rate and rhythm, S1 and S2  normal, no murmur, rub or gallop  Breast Exam:    No tenderness, masses, or nipple abnormality  Abdomen:     Soft, non-tender, bowel sounds active all four quadrants, no masses, no organomegaly.    Genitalia:    Deferred per patient request  Rectal:    Not examined.   Extremities:   Extremities normal, atraumatic, no cyanosis or edema  Pulses:   2+ and symmetric all extremities  Skin:   Skin color, texture, turgor normal, no rashes or lesions  Lymph nodes:   Cervical, supraclavicular, and axillary nodes normal  Neurologic:   CNII-XII intact, normal strength, sensation and reflexes throughout   .  Labs:  Lab Results  Component Value Date   WBC 7.8 02/12/2021   HGB 14.2 02/12/2021   HCT 41.9 02/12/2021   MCV 87 02/12/2021   PLT 283 02/12/2021    Lab Results  Component Value Date   CREATININE 0.72 02/12/2021   BUN 6 02/12/2021  NA 141 02/12/2021   K 4.4 02/12/2021   CL 102 02/12/2021   CO2 24 02/12/2021    Lab Results  Component Value Date   ALT 15 02/12/2021   AST 19 02/12/2021   ALKPHOS 60 02/12/2021   BILITOT 0.4 02/12/2021    Lab Results  Component Value Date   TSH 2.450 08/31/2018    Lab Results  Component Value Date   CHOL 369 (H) 02/12/2021   HDL 64 02/12/2021   LDLCALC 287 (H) 02/12/2021   TRIG 106 02/12/2021   CHOLHDL 5.8 (H) 02/12/2021     Latest Reference Range & Units 02/12/21 12:14  LH mIU/mL 35.4  FSH mIU/mL 87.7   Assessment:   1. Encounter for well woman exam with routine gynecological exam   2. PMB (postmenopausal bleeding)   3. History of menorrhagia    Plan:    -  Blood tests: CBC with diff, Comprehensive metabolic panel, Lipoproteins and Vitamin D.  Will also recheck hormone levels to assess menopause vs perimenopausal status.  Last year hormonal levels were noted to be in menopausal range, however patient had only been approximately 9 months without a cycle when she stopped her OCPs.  If patient is noted to be perimenopausal can  resume her OCPs.  If she is noted to be postmenopausal, would proceed with further evaluation with ultrasound and endometrial biopsy. - Breast self exam technique reviewed and patient encouraged to perform self-exam monthly. - Contraception: None.  However if patient is noted to have returned back to perimenopausal status, can resume her low Loestrin continuously.  Samples given. - Discussed healthy lifestyle modifications. - Mammogram ordered. - Pap smear up to date.  Patient desires to continue with q 5 year pap smear screens.  Also would like to do q other year pelvic exams. Pelvic exam deferred today by patient due to her bleeding. - Mild dyslipidemia present. Will follow up.  - Declines COVID vaccination.  Patient with many questions about her bleeding, all questions answered. - Return to clinic in 1 year for annual exam.  Patient will return next week for her ultrasound.   Hildred Laser, MD [facility]

## 2022-02-13 NOTE — Progress Notes (Deleted)
ANNUAL PREVENTATIVE CARE GYNECOLOGY  ENCOUNTER NOTE  Subjective:       Misty Welch is a 51 y.o. G2P0 female here for a routine annual gynecologic exam. The patient is sexually active. The patient is not taking hormone replacement therapy. Patient denies post-menopausal vaginal bleeding. The patient wears seatbelts: yes. The patient participates in regular exercise: yes. Has the patient ever been transfused or tattooed?: no. The patient reports that there is not domestic violence in her life.  Current complaints: 1.  ***    Gynecologic History No LMP recorded. (Menstrual status: Oral contraceptives). Contraception:  oral Last Pap: 08/31/2018. Results were: normal Last mammogram: 04/26/21. Results were: normal Last Colonoscopy:  Last Dexa Scan:    Obstetric History OB History  Gravida Para Term Preterm AB Living  2            SAB IAB Ectopic Multiple Live Births               # Outcome Date GA Lbr Len/2nd Weight Sex Delivery Anes PTL Lv  2 Gravida 2006    F CS-Unspec     1 Gravida 2003    M CS-Unspec       Past Medical History:  Diagnosis Date   Cyst of ovary    Elevated cholesterol    Heavy menstrual bleeding    Helicobacter positive gastritis 06/22/2017   History of vitamin D deficiency 10/05/2019   Menorrhagia 10/05/2019   Mild exercise-induced asthma 10/05/2019   Obesity (BMI 35.0-39.9 without comorbidity) 10/05/2019   Peptic ulcer     Family History  Problem Relation Age of Onset   Heart disease Maternal Grandmother    Alzheimer's disease Mother    Cirrhosis Father        non-alcoholic   Breast cancer Neg Hx     Past Surgical History:  Procedure Laterality Date   CESAREAN SECTION  2003,2006   OOPHORECTOMY Right 2011   TUBAL LIGATION  2011    Social History   Socioeconomic History   Marital status: Married    Spouse name: Not on file   Number of children: Not on file   Years of education: Not on file   Highest education level: Not on file   Occupational History   Not on file  Tobacco Use   Smoking status: Never   Smokeless tobacco: Never  Vaping Use   Vaping Use: Never used  Substance and Sexual Activity   Alcohol use: No   Drug use: No   Sexual activity: Yes    Birth control/protection: Surgical  Other Topics Concern   Not on file  Social History Narrative   Not on file   Social Determinants of Health   Financial Resource Strain: Not on file  Food Insecurity: Not on file  Transportation Needs: Not on file  Physical Activity: Not on file  Stress: Not on file  Social Connections: Not on file  Intimate Partner Violence: Not on file    Current Outpatient Medications on File Prior to Visit  Medication Sig Dispense Refill   albuterol (VENTOLIN HFA) 108 (90 Base) MCG/ACT inhaler Inhale 1-2 puffs into the lungs every 6 (six) hours as needed for wheezing or shortness of breath. 8 g 3   Norethindrone-Ethinyl Estradiol-Fe Biphas (LO LOESTRIN FE) 1 MG-10 MCG / 10 MCG tablet Take 1 tablet by mouth daily. Takes continuously, omitting placebo pills (Patient not taking: Reported on 02/12/2021) 28 tablet 1   rosuvastatin (CRESTOR) 10 MG tablet TAKE 1  TABLET BY MOUTH EVERY DAY 90 tablet 3   No current facility-administered medications on file prior to visit.    Allergies  Allergen Reactions   Macrobid [Nitrofurantoin Monohyd Macro] Anaphylaxis   Nitrofurantoin Anaphylaxis   Ciprofloxacin     Dizzy spells and feeling emotional    Penicillins     Childhood reaction      Review of Systems ROS Review of Systems - General ROS: negative for - chills, fatigue, fever, hot flashes, night sweats, weight gain or weight loss Psychological ROS: negative for - anxiety, decreased libido, depression, mood swings, physical abuse or sexual abuse Ophthalmic ROS: negative for - blurry vision, eye pain or loss of vision ENT ROS: negative for - headaches, hearing change, visual changes or vocal changes Allergy and Immunology ROS:  negative for - hives, itchy/watery eyes or seasonal allergies Hematological and Lymphatic ROS: negative for - bleeding problems, bruising, swollen lymph nodes or weight loss Endocrine ROS: negative for - galactorrhea, hair pattern changes, hot flashes, malaise/lethargy, mood swings, palpitations, polydipsia/polyuria, skin changes, temperature intolerance or unexpected weight changes Breast ROS: negative for - new or changing breast lumps or nipple discharge Respiratory ROS: negative for - cough or shortness of breath Cardiovascular ROS: negative for - chest pain, irregular heartbeat, palpitations or shortness of breath Gastrointestinal ROS: no abdominal pain, change in bowel habits, or black or bloody stools Genito-Urinary ROS: no dysuria, trouble voiding, or hematuria Musculoskeletal ROS: negative for - joint pain or joint stiffness Neurological ROS: negative for - bowel and bladder control changes Dermatological ROS: negative for rash and skin lesion changes   Objective:   There were no vitals taken for this visit. CONSTITUTIONAL: Well-developed, well-nourished female in no acute distress.  PSYCHIATRIC: Normal mood and affect. Normal behavior. Normal judgment and thought content. NEUROLGIC: Alert and oriented to person, place, and time. Normal muscle tone coordination. No cranial nerve deficit noted. HENT:  Normocephalic, atraumatic, External right and left ear normal. Oropharynx is clear and moist EYES: Conjunctivae and EOM are normal. Pupils are equal, round, and reactive to light. No scleral icterus.  NECK: Normal range of motion, supple, no masses.  Normal thyroid.  SKIN: Skin is warm and dry. No rash noted. Not diaphoretic. No erythema. No pallor. CARDIOVASCULAR: Normal heart rate noted, regular rhythm, no murmur. RESPIRATORY: Clear to auscultation bilaterally. Effort and breath sounds normal, no problems with respiration noted. BREASTS: Symmetric in size. No masses, skin changes,  nipple drainage, or lymphadenopathy. ABDOMEN: Soft, normal bowel sounds, no distention noted.  No tenderness, rebound or guarding.  BLADDER: Normal PELVIC:  Bladder {:311640}  Urethra: {:311719}  Vulva: {:311722}  Vagina: {:311643}  Cervix: {:311644}  Uterus: {:311718}  Adnexa: {:311645}  RV: {Blank multiple:19196::"External Exam NormaI","No Rectal Masses","Normal Sphincter tone"}  MUSCULOSKELETAL: Normal range of motion. No tenderness.  No cyanosis, clubbing, or edema.  2+ distal pulses. LYMPHATIC: No Axillary, Supraclavicular, or Inguinal Adenopathy.   Labs: Lab Results  Component Value Date   WBC 7.8 02/12/2021   HGB 14.2 02/12/2021   HCT 41.9 02/12/2021   MCV 87 02/12/2021   PLT 283 02/12/2021    Lab Results  Component Value Date   CREATININE 0.72 02/12/2021   BUN 6 02/12/2021   NA 141 02/12/2021   K 4.4 02/12/2021   CL 102 02/12/2021   CO2 24 02/12/2021    Lab Results  Component Value Date   ALT 15 02/12/2021   AST 19 02/12/2021   ALKPHOS 60 02/12/2021   BILITOT 0.4 02/12/2021  Lab Results  Component Value Date   CHOL 369 (H) 02/12/2021   HDL 64 02/12/2021   LDLCALC 287 (H) 02/12/2021   TRIG 106 02/12/2021   CHOLHDL 5.8 (H) 02/12/2021    Lab Results  Component Value Date   TSH 2.450 08/31/2018    Lab Results  Component Value Date   HGBA1C 4.9 08/31/2018     Assessment:   No diagnosis found.   Plan:  Pap: {Blank multiple:19196::"Pap, Reflex if ASCUS","Pap Co Test","GC/CT NAAT","Not needed","Not done"} Mammogram: {Blank multiple:19196::"***","Ordered","Not Ordered","Not Indicated"} Colon Screening:  {Blank multiple:19196::"***","Ordered","Not Ordered","Not Indicated"} Labs: {Blank multiple:19196::"Lipid 1","FBS","TSH","Hemoglobin A1C","Vit D Level""***"} Routine preventative health maintenance measures emphasized: {Blank multiple:19196::"Exercise/Diet/Weight control","Tobacco Warnings","Alcohol/Substance use risks","Stress  Management","Peer Pressure Issues","Safe Sex"} COVID Vaccination status: Return to Clinic - 1 Year   Loney Laurence, CMA

## 2022-02-14 LAB — CBC
Hematocrit: 34.8 % (ref 34.0–46.6)
Hemoglobin: 11.8 g/dL (ref 11.1–15.9)
MCH: 28.4 pg (ref 26.6–33.0)
MCHC: 33.9 g/dL (ref 31.5–35.7)
MCV: 84 fL (ref 79–97)
Platelets: 278 10*3/uL (ref 150–450)
RBC: 4.15 x10E6/uL (ref 3.77–5.28)
RDW: 12.5 % (ref 11.7–15.4)
WBC: 9.3 10*3/uL (ref 3.4–10.8)

## 2022-02-14 LAB — ESTRADIOL: Estradiol: 129 pg/mL

## 2022-02-14 LAB — COMPREHENSIVE METABOLIC PANEL
ALT: 14 IU/L (ref 0–32)
AST: 19 IU/L (ref 0–40)
Albumin/Globulin Ratio: 1.9 (ref 1.2–2.2)
Albumin: 4.6 g/dL (ref 3.9–4.9)
Alkaline Phosphatase: 58 IU/L (ref 44–121)
BUN/Creatinine Ratio: 19 (ref 9–23)
BUN: 11 mg/dL (ref 6–24)
Bilirubin Total: 0.3 mg/dL (ref 0.0–1.2)
CO2: 24 mmol/L (ref 20–29)
Calcium: 9.2 mg/dL (ref 8.7–10.2)
Chloride: 103 mmol/L (ref 96–106)
Creatinine, Ser: 0.57 mg/dL (ref 0.57–1.00)
Globulin, Total: 2.4 g/dL (ref 1.5–4.5)
Glucose: 91 mg/dL (ref 70–99)
Potassium: 4.1 mmol/L (ref 3.5–5.2)
Sodium: 142 mmol/L (ref 134–144)
Total Protein: 7 g/dL (ref 6.0–8.5)
eGFR: 111 mL/min/{1.73_m2} (ref >59)

## 2022-02-14 LAB — LIPID PANEL
Chol/HDL Ratio: 5.4 ratio — ABNORMAL HIGH (ref 0.0–4.4)
Cholesterol, Total: 309 mg/dL — ABNORMAL HIGH (ref 100–199)
HDL: 57 mg/dL (ref >39)
LDL Chol Calc (NIH): 239 mg/dL — ABNORMAL HIGH (ref 0–99)
Triglycerides: 84 mg/dL (ref 0–149)
VLDL Cholesterol Cal: 13 mg/dL (ref 5–40)

## 2022-02-14 LAB — HEMOGLOBIN A1C
Est. average glucose Bld gHb Est-mCnc: 91 mg/dL
Hgb A1c MFr Bld: 4.8 % (ref 4.8–5.6)

## 2022-02-14 LAB — PROGESTERONE: Progesterone: 0.4 ng/mL

## 2022-02-14 LAB — TSH: TSH: 0.922 u[IU]/mL (ref 0.450–4.500)

## 2022-02-14 LAB — FSH/LH
FSH: 16.8 m[IU]/mL
LH: 12 m[IU]/mL

## 2022-02-18 ENCOUNTER — Other Ambulatory Visit: Payer: 59

## 2022-02-21 ENCOUNTER — Telehealth: Payer: Self-pay | Admitting: Obstetrics and Gynecology

## 2022-02-21 NOTE — Telephone Encounter (Signed)
Patient called and states she saw Dr.cherry last week due to heavy bleeding. Pt states she doubled up on her [facility] for 2 full days as advised. Pt states it did slow the bleeding but now that she has stopped with the double dose the flow has returned to heavy. Pt wants to know if she should continue doubling her dose of BC. Please advise.

## 2022-02-24 NOTE — Telephone Encounter (Signed)
Yes.  She needs to double up on them and take for at least 5 straight days before dropping back down to 1 tablet.

## 2022-02-24 NOTE — Telephone Encounter (Signed)
Called and spoke with patient. Relayed advised information given by provider. Pt voiced understanding.

## 2022-03-03 ENCOUNTER — Encounter: Payer: Self-pay | Admitting: Obstetrics and Gynecology

## 2022-03-04 MED ORDER — NORETHINDRONE ACETATE 5 MG PO TABS: 5.0000 mg | ORAL_TABLET | Freq: Every day | ORAL | 3 refills | Status: DC

## 2022-03-11 ENCOUNTER — Encounter: Payer: Self-pay | Admitting: Obstetrics and Gynecology

## 2022-03-11 ENCOUNTER — Ambulatory Visit (INDEPENDENT_AMBULATORY_CARE_PROVIDER_SITE_OTHER): Payer: 59

## 2022-03-11 ENCOUNTER — Ambulatory Visit (INDEPENDENT_AMBULATORY_CARE_PROVIDER_SITE_OTHER): Payer: 59 | Admitting: Obstetrics and Gynecology

## 2022-03-11 VITALS — BP 134/79 | HR 99 | Resp 16 | Ht 61.0 in | Wt 173.1 lb

## 2022-03-11 DIAGNOSIS — N924 Excessive bleeding in the premenopausal period: Secondary | ICD-10-CM

## 2022-03-11 DIAGNOSIS — N8003 Adenomyosis of the uterus: Secondary | ICD-10-CM

## 2022-03-11 DIAGNOSIS — N950 Postmenopausal bleeding: Secondary | ICD-10-CM | POA: Diagnosis not present

## 2022-03-11 MED ORDER — ETONOGESTREL-ETHINYL ESTRADIOL 0.12-0.015 MG/24HR VA RING: VAGINAL_RING | VAGINAL | 0 refills | Status: DC

## 2022-03-11 NOTE — Progress Notes (Signed)
    GYNECOLOGY PROGRESS NOTE  Subjective:    Patient ID: Misty Welch, female    DOB: 1971-02-14, 51 y.o.   MRN: [id number]  HPI  Patient is a 51 y.o. G57P0 female who presents for follow up after ultrasound and futher management of her abnormal uterine bleeding.  She has been bleeding for 7 weeks now, previously thought to be in menopause, however recent labs note that patient has returned back to perimenopausal status. She was taking Lo Loestrin at resumption of her cycles but bleeding continued, despite doubling dose for 5 days. She was then initiated on Aygestin, but she said medication caused her insomnia and jitters, and so discontinued this medication recently after only 5 days of use.  Reports that she has read about possible causes, as well as treatment of her bleeding and was initially interested in endometrial ablation.   The following portions of the patient's history were reviewed and updated as appropriate: allergies, current medications, past family history, past medical history, past social history, past surgical history, and problem list.  Review of Systems Pertinent items are noted in HPI.   Objective:   Blood pressure 134/79, pulse 99, resp. rate 16, height 5\' 1"  (1.549 m), weight 173 lb 1.6 oz (78.5 kg), last menstrual period 01/30/2022.  Body mass index is 32.71 kg/m.  General appearance: alert, cooperative, and no distress Remainder of exam deferred.    Imaging:  Patient Name: Misty Welch DOB: Sep 07, 1970 MRN: [id number] LMP: bleeding x 6 weeks   ULTRASOUND REPORT   Location: [facility] Date of Service: 03/11/2022        Indications: Postmenopausal bleeding Findings:  The uterus is retroverted and measures 7.4 x 5.0 x 5.9 cm. Echo texture is heterogenous without evidence of focal masses. The uterus appears heterogeneous with tiny cystic changes throughout; suspicious for Adenomyosis.    The Endometrium measures 2.9 mm.     Right  Ovary is surgically absent Left Ovary measures 2.2 x 1.7 x 1.6 cm. It is normal in appearance, although, ? adherence to uterus (does not move when pushing with u/s probe, patient very tender). Survey of the adnexa demonstrates no adnexal masses. There is mild free fluid in the cul de sac.   Impression: 1. Possible Adenomyosis 2. Possible adherent left ovary   Recommendations: 1.Clinical correlation with the patient's History and Physical Exam.  Assessment:   1. Abnormal perimenopausal bleeding   2. Adenomyosis      Plan:   - Lengthy discussion had on management options for abnormal perimenopausal bleeding. Discussed trial of other hormonal therapy, patient's initial desire for endometrial ablation, as well as hysterectomy.  After lengthy discussion, patient now leaning more towards hysterectomy. Discussed need to complete follow up with endometrial biopsy. Will follow up in several weeks to complete work up. Will tentatively schedule surgery for 04/07/22.  In meantime, patient desires to try NuvaRing for bleeding management, will prescribe.  - Discussed ultrasound findings with patient, and further implications.  Advised that endometrial ablation may still lead to post-ablation pain and bleeding if performed.    A total of 35 minutes were spent face-to-face with the patient during this encounter and over half of that time involved counseling and coordination of care.   04/09/22, MD [facility]

## 2022-03-26 ENCOUNTER — Encounter: Payer: 59 | Admitting: Obstetrics and Gynecology

## 2022-03-28 ENCOUNTER — Inpatient Hospital Stay: Admission: RE | Admit: 2022-03-28 | Payer: 59 | Source: Ambulatory Visit

## 2022-03-31 ENCOUNTER — Other Ambulatory Visit: Payer: Self-pay

## 2022-03-31 ENCOUNTER — Telehealth: Payer: Self-pay | Admitting: Obstetrics and Gynecology

## 2022-03-31 MED ORDER — ALBUTEROL SULFATE HFA 108 (90 BASE) MCG/ACT IN AERS: 1.0000 | INHALATION_SPRAY | Freq: Four times a day (QID) | RESPIRATORY_TRACT | 3 refills | Status: DC | PRN

## 2022-03-31 NOTE — Telephone Encounter (Signed)
Patient is needing to get a refill on her albuterol. Would like meds called to [facility].   CB# [phone]

## 2022-03-31 NOTE — Telephone Encounter (Signed)
Rx sent to pharmacy on file.

## 2022-04-02 ENCOUNTER — Encounter: Payer: Self-pay | Admitting: Obstetrics and Gynecology

## 2022-04-07 MED ORDER — AZITHROMYCIN 250 MG PO TABS: ORAL_TABLET | ORAL | 1 refills | Status: DC

## 2022-04-26 ENCOUNTER — Other Ambulatory Visit: Payer: Self-pay | Admitting: Obstetrics and Gynecology

## 2022-04-26 DIAGNOSIS — Z01818 Encounter for other preprocedural examination: Secondary | ICD-10-CM

## 2022-04-28 NOTE — Progress Notes (Unsigned)
GYNECOLOGY PREOPERATIVE HISTORY AND PHYSICAL   Subjective:  Misty Welch is a 51 y.o. [phone] here for surgical management of abnormal perimenopausal bleeding and adenomyosis. Patient with amenorrhea for ~ 1 year, then began resuming bleeding for ~ 2 months without cessation. Labs noted patient was no longer in menopause. Attempted management of bleeding with low-dose OCPs and NuvaRing without relief. Initially desired endometrial ablation however after ultrasound noted findings of adenomyosis, now desires definitve management. Patient also with a history of mild anemia. No significant preoperative concerns.  Proposed surgery: LAPAROSCOPIC ASSISTED VAGINAL HYSTERECTOMY, POSSIBLE LEFT OOPHORECTOMY     Pertinent Gynecological History:  Bleeding: Abnormal perimenopausal bleeding Contraception: post menopausal status Last mammogram: normal Date: 04/26/2021 Last pap: normal Date: 08/31/2018   Past Medical History:  Diagnosis Date   Cyst of ovary    Elevated cholesterol    Heavy menstrual bleeding    Helicobacter positive gastritis 06/22/2017   History of vitamin D deficiency 10/05/2019   Menorrhagia 10/05/2019   Mild exercise-induced asthma 10/05/2019   Obesity (BMI 35.0-39.9 without comorbidity) 10/05/2019   Peptic ulcer    Past Surgical History:  Procedure Laterality Date   CESAREAN SECTION  2003,2006   OOPHORECTOMY Right 2011   TUBAL LIGATION  2011   OB History  Gravida Para Term Preterm AB Living  2            SAB IAB Ectopic Multiple Live Births               # Outcome Date GA Lbr Len/2nd Weight Sex Delivery Anes PTL Lv  2 Gravida 2006    F CS-Unspec     1 Gravida 2003    M CS-Unspec     Patient denies any other pertinent gynecologic issues.  Family History  Problem Relation Age of Onset   Heart disease Maternal Grandmother    Alzheimer's disease Mother    Cirrhosis Father        non-alcoholic   Breast cancer Neg Hx    Social History   Socioeconomic History    Marital status: Married    Spouse name: Not on file   Number of children: Not on file   Years of education: Not on file   Highest education level: Not on file  Occupational History   Not on file  Tobacco Use   Smoking status: Never   Smokeless tobacco: Never  Vaping Use   Vaping Use: Never used  Substance and Sexual Activity   Alcohol use: No   Drug use: No   Sexual activity: Yes    Birth control/protection: Surgical  Other Topics Concern   Not on file  Social History Narrative   Not on file   Social Determinants of Health   Financial Resource Strain: Not on file  Food Insecurity: Not on file  Transportation Needs: Not on file  Physical Activity: Not on file  Stress: Not on file  Social Connections: Not on file  Intimate Partner Violence: Not on file   Current Outpatient Medications on File Prior to Visit  Medication Sig Dispense Refill   azithromycin (ZITHROMAX) 250 MG tablet Take as directed: Two pills by mouth the first day, then one pill every day until completed 6 tablet 1   albuterol (VENTOLIN HFA) 108 (90 Base) MCG/ACT inhaler Inhale 1-2 puffs into the lungs every 6 (six) hours as needed for wheezing or shortness of breath. 8 g 3   etonogestrel-ethinyl estradiol (NUVARING) 0.12-0.015 MG/24HR vaginal ring Insert vaginally and  leave in place for 3 consecutive weeks, then remove for 1 week. 3 each 0   rosuvastatin (CRESTOR) 10 MG tablet TAKE 1 TABLET BY MOUTH EVERY DAY (Patient not taking: Reported on 03/11/2022) 90 tablet 3   No current facility-administered medications on file prior to visit.   Allergies  Allergen Reactions   Macrobid [Nitrofurantoin Monohyd Macro] Anaphylaxis   Nitrofurantoin Anaphylaxis   Ciprofloxacin     Dizzy spells and feeling emotional    Penicillins     Childhood reaction      Review of Systems Constitutional: No recent fever/chills/sweats Respiratory: No recent cough/bronchitis Cardiovascular: No chest pain Gastrointestinal:  No recent nausea/vomiting/diarrhea Genitourinary: No UTI symptoms Hematologic/lymphatic:No history of coagulopathy or recent blood thinner use    Objective:   Blood pressure (!) 128/90, pulse 61, resp. rate 16, height 5\' 1"  (1.549 m), weight 175 lb 8 oz (79.6 kg), last menstrual period 01/24/2022.  CONSTITUTIONAL: Well-developed, well-nourished female in no acute distress.  HENT:  Normocephalic, atraumatic, External right and left ear normal. Oropharynx is clear and moist EYES: Conjunctivae and EOM are normal. Pupils are equal, round, and reactive to light. No scleral icterus.  NECK: Normal range of motion, supple, no masses SKIN: Skin is warm and dry. No rash noted. Not diaphoretic. No erythema. No pallor. NEUROLOGIC: Alert and oriented to person, place, and time. Normal reflexes, muscle tone coordination. No cranial nerve deficit noted. PSYCHIATRIC: Normal mood and affect. Normal behavior. Normal judgment and thought content. CARDIOVASCULAR: Normal heart rate noted, regular rhythm RESPIRATORY: Effort and breath sounds normal, no problems with respiration noted ABDOMEN: Soft, nontender, nondistended. PELVIC: Deferred MUSCULOSKELETAL: Normal range of motion. No edema and no tenderness. 2+ distal pulses.    Labs: Lab Results  Component Value Date   WBC 9.3 02/13/2022   HGB 11.8 02/13/2022   HCT 34.8 02/13/2022   MCV 84 02/13/2022   PLT 278 02/13/2022     Imaging Studies: Patient Name: Misty Welch DOB: 04-02-1971 MRN: [id number] LMP: bleeding x 6 weeks   ULTRASOUND REPORT   Location: [facility] Date of Service: 03/11/2022        Indications: Postmenopausal bleeding Findings:  The uterus is retroverted and measures 7.4 x 5.0 x 5.9 cm. Echo texture is heterogenous without evidence of focal masses. The uterus appears heterogeneous with tiny cystic changes throughout; suspicious for Adenomyosis.    The Endometrium measures 2.9 mm.     Right Ovary is  surgically absent Left Ovary measures 2.2 x 1.7 x 1.6 cm. It is normal in appearance, although, possibly adherent to uterus (does not move when pushing with u/s probe, patient very tender). Survey of the adnexa demonstrates no adnexal masses. There is mild free fluid in the cul de sac.   Impression: 1. Possible Adenomyosis 2. Possible adherent left ovary   Recommendations: 1.Clinical correlation with the patient's History and Physical Exam.     03/13/2022, RDMS, RVT      I have reviewed this study and agree with documented findings.      Willette Alma, MD [facility]   Assessment:    Abnormal uterine bleeding Suspected adenomyosis  Plan:   - Patient desires definitive management with hysterectomy (LAVH with bilateral salpingectomy).  Currently no  indication for oophorectomy however discussed possibility of removal if ovary appeared diseased or suspected endometriosis present.  Patient agrees with this proposed surgery.  The risks of surgery were discussed in detail with the patient including but not limited to: bleeding which  may require transfusion or reoperation; infection which may require antibiotics; injury to bowel, bladder, ureters or other surrounding organs; need for additional procedures including laparotomy or subsequent procedures secondary to abnormal pathology; formation of adhesions; thromboembolic phenomenon; incisional problems and other postoperative/anesthesia complications.  Patient was advised that she will be discharged home after surgery and expected recovery time after a hysterectomy is 6-8 weeks.  Patient was told that the likelihood that her condition and symptoms will be treated effectively with this surgical management was very high; the postoperative expectations were also discussed in detail. The patient also understands the alternative treatment options which have previously been discussed in full. All questions were answered.   - Preop  testing ordered. - Instructions reviewed, including NPO after midnight. - All questions answered.      Hildred Laser, MD [facility]

## 2022-04-29 ENCOUNTER — Ambulatory Visit (INDEPENDENT_AMBULATORY_CARE_PROVIDER_SITE_OTHER): Payer: 59 | Admitting: Obstetrics and Gynecology

## 2022-04-29 ENCOUNTER — Encounter: Payer: Self-pay | Admitting: Obstetrics and Gynecology

## 2022-04-29 ENCOUNTER — Ambulatory Visit
Admission: RE | Admit: 2022-04-29 | Discharge: 2022-04-29 | Disposition: A | Discharge status: Home / Self Care | Payer: 59 | Source: Ambulatory Visit | Attending: Obstetrics and Gynecology | Admitting: Obstetrics and Gynecology

## 2022-04-29 VITALS — BP 128/90 | HR 61 | Resp 16 | Ht 61.0 in | Wt 175.5 lb

## 2022-04-29 DIAGNOSIS — Z1231 Encounter for screening mammogram for malignant neoplasm of breast: Secondary | ICD-10-CM | POA: Insufficient documentation

## 2022-04-29 DIAGNOSIS — Z01818 Encounter for other preprocedural examination: Secondary | ICD-10-CM

## 2022-04-29 NOTE — Patient Instructions (Signed)
GYNECOLOGY PRE-OPERATIVE INSTRUCTIONS  You are scheduled for surgery on 05/12/2022.  The name of your procedure is: Laparoscopic-Assisted Vaginal Hysterectomy, with Possible Left Oophorectomy (removal of the uterus and tubes, ,possible removal of left ovary).   Please read through these instructions carefully regarding preparation for your surgery: Nothing to eat after midnight on the day prior to surgery.  Do not take any medications unless recommended by your provider on day prior to surgery.  Do not take NSAIDs (Motrin, Aleve) or aspirin 7 days prior to surgery.  You may take Tylenol products for minor aches and pains.  You will receive a prescription for pain medications post-operatively.  You will be contacted by phone approximately 1-2 weeks prior to surgery to schedule your pre-operative appointment.  Please call the office if you have any questions regarding your upcoming surgery.    Thank you for choosing [facility].

## 2022-05-01 ENCOUNTER — Inpatient Hospital Stay: Admission: RE | Admit: 2022-05-01 | Payer: 59 | Source: Ambulatory Visit

## 2022-05-05 ENCOUNTER — Encounter
Admission: RE | Admit: 2022-05-05 | Discharge: 2022-05-05 | Disposition: A | Discharge status: Home / Self Care | Payer: 59 | Source: Ambulatory Visit | Attending: Obstetrics and Gynecology | Admitting: Obstetrics and Gynecology

## 2022-05-05 VITALS — Ht 61.0 in | Wt 176.0 lb

## 2022-05-05 DIAGNOSIS — Z01812 Encounter for preprocedural laboratory examination: Secondary | ICD-10-CM

## 2022-05-05 HISTORY — DX: Anemia, unspecified: D64.9

## 2022-05-05 NOTE — Patient Instructions (Addendum)
Your procedure is scheduled on: Monday May 12, 2022. Report to Day Surgery inside [facility] 2nd floor, stop by registration desk before getting on elevator. To find out your arrival time please call [phone] between 1PM - 3PM on Friday May 09, 2022.  Remember: Instructions that are not followed completely may result in serious medical risk,  up to and including death, or upon the discretion of your surgeon and anesthesiologist your  surgery may need to be rescheduled.     _X__ 1. Do not eat food after midnight the night before your procedure.                 No chewing gum or hard candies. You may drink clear liquids up to 2 hours                 before you are scheduled to arrive for your surgery- DO not drink clear                 liquids within 2 hours of the start of your surgery.                 Clear Liquids include:  water, apple juice without pulp, clear Gatorade, G2 or                  Gatorade Zero (avoid Red/Purple/Blue), Black Coffee or Tea (Do not add                 anything to coffee or tea).  __X__2.   Complete the "Ensure Clear Pre-surgery Clear Carbohydrate Drink" provided to you, 2 hours before arrival. **If you are diabetic you will be provided with an alternative drink, Gatorade Zero or G2.  __X__3.  On the morning of surgery brush your teeth with toothpaste and water, you                may rinse your mouth with mouthwash if you wish.  Do not swallow any toothpaste or mouthwash.     _X__ 4.  No Alcohol for 24 hours before or after surgery.   _X__ 5.  Do Not Smoke or use e-cigarettes For 24 Hours Prior to Your Surgery.                 Do not use any chewable tobacco products for at least 6 hours prior to                 Surgery.  _X__  6.  Do not use any recreational drugs (marijuana, cocaine, heroin, ecstasy, MDMA or other)                For at least one week prior to your surgery.  Combination of these drugs with anesthesia                 May have life threatening results.  ____  7.  Bring all medications with you on the day of surgery if instructed.   __X_ 8.  Notify your doctor if there is any change in your medical condition      (cold, fever, infections).     Do not wear jewelry, make-up, hairpins, clips or nail polish. Do not wear lotions, powders, or perfumes. You may wear deodorant. Do not shave 48 hours prior to surgery. Men may shave face and neck. Do not bring valuables to the hospital.    [facility] is not responsible for any belongings or valuables.  Contacts,  dentures or bridgework may not be worn into surgery. Leave your suitcase in the car. After surgery it may be brought to your room. For patients admitted to the hospital, discharge time is determined by your treatment team.   Patients discharged the day of surgery will not be allowed to drive home.   Make arrangements for someone to be with you for the first 24 hours of your Same Day Discharge.   __X__ Take these medicines the morning of surgery with A SIP OF WATER:    1. None   2.   3.   4.  5.  6.  ____ Fleet Enema (as directed)   __X__ Use CHG Soap (or wipes) as directed  ____ Use Benzoyl Peroxide Gel as instructed  _X___ Use inhalers on the day of surgery  albuterol (VENTOLIN HFA) 108 (90 Base) MCG/ACT inhaler  ____ Stop metformin 2 days prior to surgery    ____ Take 1/2 of usual insulin dose the night before surgery. No insulin the morning          of surgery.   ____ Call your PCP, cardiologist, or Pulmonologist if taking Coumadin/Plavix/aspirin and ask when to stop before your surgery.   __X__ One Week prior to surgery- Stop Anti-inflammatories such as Ibuprofen, Aleve, Advil, Motrin, meloxicam (MOBIC), diclofenac, etodolac, ketorolac, Toradol, Daypro, piroxicam, Goody's or BC powders. OK TO USE TYLENOL IF NEEDED   __X__ Do not start any new vitamins and or supplements until after surgery.    ____ Bring C-Pap  to the hospital.    If you have any questions regarding your pre-procedure instructions,  Please call Pre-admit Testing at [phone]

## 2022-05-06 ENCOUNTER — Encounter: Payer: Self-pay | Admitting: Obstetrics and Gynecology

## 2022-05-07 ENCOUNTER — Encounter: Payer: Self-pay | Admitting: Urgent Care

## 2022-05-07 ENCOUNTER — Other Ambulatory Visit
Admission: RE | Admit: 2022-05-07 | Discharge: 2022-05-07 | Disposition: A | Discharge status: Home / Self Care | Payer: 59 | Attending: Obstetrics and Gynecology | Admitting: Obstetrics and Gynecology

## 2022-05-07 ENCOUNTER — Inpatient Hospital Stay: Admission: RE | Admit: 2022-05-07 | Payer: 59 | Source: Ambulatory Visit

## 2022-05-09 ENCOUNTER — Telehealth: Payer: Self-pay | Admitting: Obstetrics and Gynecology

## 2022-05-09 NOTE — Telephone Encounter (Signed)
Patient called and states that pre admission called her today in reference to her surgery on Monday. Patient states she has been in touch with Dr.Cherry and the surgery was to be cancelled for Monday and be rescheduled for sometime in December. Please advise.

## 2022-05-12 ENCOUNTER — Ambulatory Visit: Admission: RE | Admit: 2022-05-12 | Payer: 59 | Source: Home / Self Care | Admitting: Obstetrics and Gynecology

## 2022-05-12 ENCOUNTER — Encounter: Admission: RE | Payer: Self-pay | Source: Home / Self Care

## 2022-05-12 SURGERY — HYSTERECTOMY, VAGINAL, LAPAROSCOPY-ASSISTED
Anesthesia: General

## 2022-05-20 ENCOUNTER — Telehealth: Payer: Self-pay

## 2022-05-20 ENCOUNTER — Encounter: Payer: 59 | Admitting: Obstetrics and Gynecology

## 2022-05-20 NOTE — Telephone Encounter (Signed)
Pt meant to say 11/20, also a mychart msg in there

## 2022-05-20 NOTE — Telephone Encounter (Signed)
Pt calling to get her surgery back on the books; prefers Monday 06/07/22.  Adv will send msg to the surg sched.  Pt's number [phone]

## 2022-05-23 ENCOUNTER — Encounter: Payer: Self-pay | Admitting: Obstetrics and Gynecology

## 2022-06-15 ENCOUNTER — Other Ambulatory Visit: Payer: Self-pay | Admitting: Obstetrics and Gynecology

## 2022-06-15 DIAGNOSIS — Z01818 Encounter for other preprocedural examination: Secondary | ICD-10-CM

## 2022-06-16 ENCOUNTER — Encounter: Payer: Self-pay | Admitting: Urgent Care

## 2022-06-16 ENCOUNTER — Encounter
Admission: RE | Admit: 2022-06-16 | Discharge: 2022-06-16 | Disposition: A | Discharge status: Home / Self Care | Payer: 59 | Source: Ambulatory Visit | Attending: Obstetrics and Gynecology | Admitting: Obstetrics and Gynecology

## 2022-06-16 ENCOUNTER — Encounter: Payer: Self-pay | Admitting: Obstetrics and Gynecology

## 2022-06-16 DIAGNOSIS — Z01818 Encounter for other preprocedural examination: Secondary | ICD-10-CM

## 2022-06-16 DIAGNOSIS — Z01812 Encounter for preprocedural laboratory examination: Secondary | ICD-10-CM | POA: Insufficient documentation

## 2022-06-16 LAB — COMPREHENSIVE METABOLIC PANEL
ALT: 21 U/L (ref 0–44)
AST: 27 U/L (ref 15–41)
Albumin: 4.2 g/dL (ref 3.5–5.0)
Alkaline Phosphatase: 59 U/L (ref 38–126)
Anion gap: 6 (ref 5–15)
BUN: 7 mg/dL (ref 6–20)
CO2: 26 mmol/L (ref 22–32)
Calcium: 9 mg/dL (ref 8.9–10.3)
Chloride: 110 mmol/L (ref 98–111)
Creatinine, Ser: 0.63 mg/dL (ref 0.44–1.00)
GFR, Estimated: >60 mL/min (ref >60)
Glucose, Bld: 111 mg/dL — ABNORMAL HIGH (ref 70–99)
Potassium: 3.6 mmol/L (ref 3.5–5.1)
Sodium: 142 mmol/L (ref 135–145)
Total Bilirubin: 0.7 mg/dL (ref 0.3–1.2)
Total Protein: 7.8 g/dL (ref 6.5–8.1)

## 2022-06-16 LAB — TYPE AND SCREEN
ABO/RH(D): O POS
Antibody Screen: NEGATIVE

## 2022-06-16 LAB — IRON AND TIBC
Iron: 25 ug/dL — ABNORMAL LOW (ref 28–170)
Saturation Ratios: 5 % — ABNORMAL LOW (ref 10.4–31.8)
TIBC: 533 ug/dL — ABNORMAL HIGH (ref 250–450)
UIBC: 508 ug/dL

## 2022-06-16 LAB — CBC
HCT: 30.6 % — ABNORMAL LOW (ref 36.0–46.0)
Hemoglobin: 8.7 g/dL — ABNORMAL LOW (ref 12.0–15.0)
MCH: 18.1 pg — ABNORMAL LOW (ref 26.0–34.0)
MCHC: 28.4 g/dL — ABNORMAL LOW (ref 30.0–36.0)
MCV: 63.8 fL — ABNORMAL LOW (ref 80.0–100.0)
Platelets: 399 10*3/uL (ref 150–400)
RBC: 4.8 MIL/uL (ref 3.87–5.11)
RDW: 19.6 % — ABNORMAL HIGH (ref 11.5–15.5)
WBC: 7.4 10*3/uL (ref 4.0–10.5)
nRBC: 0 % (ref 0.0–0.2)

## 2022-06-16 LAB — FERRITIN: Ferritin: 2 ng/mL — ABNORMAL LOW (ref 11–307)

## 2022-06-16 LAB — HEPATITIS C ANTIBODY: HCV Ab: NONREACTIVE

## 2022-06-16 NOTE — Patient Instructions (Signed)
Your procedure is scheduled on: 06/23/22 - Monday Report to the Registration Desk on the 1st floor of [facility]. To find out your arrival time, please call [phone] between 1PM - 3PM on: 06/20/22 - Friday If your arrival time is 6:00 am, do not arrive prior to that time as the [facility] entrance doors do not open until 6:00 am.  REMEMBER: Instructions that are not followed completely may result in serious medical risk, up to and including death; or upon the discretion of your surgeon and anesthesiologist your surgery may need to be rescheduled.  Do not eat food or drink any liquids after midnight the night before surgery.  No gum chewing, lozengers or hard candies.  You may however, drink CLEAR liquids up to 2 hours before you are scheduled to arrive for your surgery. Do not drink anything within 2 hours of your scheduled arrival time.  TAKE THESE MEDICATIONS THE MORNING OF SURGERY WITH A SIP OF WATER: NONE  One week prior to surgery: Stop Anti-inflammatories (NSAIDS) such as Advil, Aleve, Ibuprofen, Motrin, Naproxen, Naprosyn and Aspirin based products such as Excedrin, Goodys Powder, BC Powder.  Stop ANY OVER THE COUNTER supplements until after surgery.  You may however, continue to take Tylenol if needed for pain up until the day of surgery.  No Alcohol for 24 hours before or after surgery.  No Smoking including e-cigarettes for 24 hours prior to surgery.  No chewable tobacco products for at least 6 hours prior to surgery.  No nicotine patches on the day of surgery.  Do not use any "recreational" drugs for at least a week prior to your surgery.  Please be advised that the combination of cocaine and anesthesia may have negative outcomes, up to and including death. If you test positive for cocaine, your surgery will be cancelled.  On the morning of surgery brush your teeth with toothpaste and water, you may rinse your mouth with mouthwash if you wish. Do not swallow  any toothpaste or mouthwash.  Use CHG Soap or wipes as directed on instruction sheet.  Do not wear jewelry, make-up, hairpins, clips or nail polish.  Do not wear lotions, powders, or perfumes.   Do not shave body from the neck down 48 hours prior to surgery just in case you cut yourself which could leave a site for infection.  Also, freshly shaved skin may become irritated if using the CHG soap.  Contact lenses, hearing aids and dentures may not be worn into surgery.  Do not bring valuables to the hospital. [facility] is not responsible for any missing/lost belongings or valuables.   Notify your doctor if there is any change in your medical condition (cold, fever, infection).  Wear comfortable clothing (specific to your surgery type) to the hospital.  After surgery, you can help prevent lung complications by doing breathing exercises.  Take deep breaths and cough every 1-2 hours. Your doctor may order a device called an Incentive Spirometer to help you take deep breaths. When coughing or sneezing, hold a pillow firmly against your incision with both hands. This is called "splinting." Doing this helps protect your incision. It also decreases belly discomfort.  If you are being admitted to the hospital overnight, leave your suitcase in the car. After surgery it may be brought to your room.  If you are being discharged the day of surgery, you will not be allowed to drive home. You will need a responsible adult (18 years or older) to drive  you home and stay with you that night.   If you are taking public transportation, you will need to have a responsible adult (18 years or older) with you. Please confirm with your physician that it is acceptable to use public transportation.   Please call the Pre-admissions Testing Dept. at [phone] if you have any questions about these instructions.  Surgery Visitation Policy:  Patients undergoing a surgery or procedure may have two family  members or support persons with them as long as the person is not COVID-19 positive or experiencing its symptoms.   Inpatient Visitation:    Visiting hours are 7 a.m. to 8 p.m. Up to four visitors are allowed at one time in a patient room. The visitors may rotate out with other people during the day. One designated support person (adult) may remain overnight.  MASKING: Due to an increase in RSV rates and hospitalizations, starting Wednesday, Nov. 15, in patient care areas in which we serve newborns, infants and children, masks will be required for teammates and visitors.  Children ages 16 and under may not visit. This policy affects the following departments only:  [facility] Postpartum area Mother Baby Unit Newborn nursery/Special care nursery  Other areas: Masks continue to be strongly recommended for patient-facing teammates, visitors and patients in all other areas. Visitation is not restricted outside of the units listed above.

## 2022-06-23 ENCOUNTER — Ambulatory Visit: Payer: 59 | Admitting: Anesthesiology

## 2022-06-23 ENCOUNTER — Ambulatory Visit
Admission: RE | Admit: 2022-06-23 | Discharge: 2022-06-23 | Disposition: A | Discharge status: Home / Self Care | Payer: 59 | Attending: Obstetrics and Gynecology | Admitting: Obstetrics and Gynecology

## 2022-06-23 ENCOUNTER — Encounter: Payer: Self-pay | Admitting: Obstetrics and Gynecology

## 2022-06-23 ENCOUNTER — Other Ambulatory Visit: Payer: Self-pay

## 2022-06-23 ENCOUNTER — Encounter
Admission: RE | Disposition: A | Discharge status: Home / Self Care | Payer: Self-pay | Source: Home / Self Care | Attending: Obstetrics and Gynecology

## 2022-06-23 DIAGNOSIS — N809 Endometriosis, unspecified: Secondary | ICD-10-CM | POA: Insufficient documentation

## 2022-06-23 DIAGNOSIS — N72 Inflammatory disease of cervix uteri: Secondary | ICD-10-CM | POA: Insufficient documentation

## 2022-06-23 DIAGNOSIS — D500 Iron deficiency anemia secondary to blood loss (chronic): Secondary | ICD-10-CM | POA: Insufficient documentation

## 2022-06-23 DIAGNOSIS — E7800 Pure hypercholesterolemia, unspecified: Secondary | ICD-10-CM | POA: Diagnosis not present

## 2022-06-23 DIAGNOSIS — N8003 Adenomyosis of the uterus: Secondary | ICD-10-CM | POA: Diagnosis not present

## 2022-06-23 DIAGNOSIS — N838 Other noninflammatory disorders of ovary, fallopian tube and broad ligament: Secondary | ICD-10-CM | POA: Insufficient documentation

## 2022-06-23 DIAGNOSIS — J45990 Exercise induced bronchospasm: Secondary | ICD-10-CM | POA: Insufficient documentation

## 2022-06-23 DIAGNOSIS — N8302 Follicular cyst of left ovary: Secondary | ICD-10-CM | POA: Insufficient documentation

## 2022-06-23 DIAGNOSIS — E669 Obesity, unspecified: Secondary | ICD-10-CM | POA: Insufficient documentation

## 2022-06-23 DIAGNOSIS — Z6832 Body mass index (BMI) 32.0-32.9, adult: Secondary | ICD-10-CM | POA: Insufficient documentation

## 2022-06-23 DIAGNOSIS — N924 Excessive bleeding in the premenopausal period: Secondary | ICD-10-CM | POA: Diagnosis present

## 2022-06-23 HISTORY — PX: LAPAROSCOPIC VAGINAL HYSTERECTOMY WITH SALPINGECTOMY: SHX6680

## 2022-06-23 SURGERY — HYSTERECTOMY, VAGINAL, LAPAROSCOPY-ASSISTED, WITH SALPINGECTOMY
Anesthesia: General | Laterality: Left

## 2022-06-23 MED ORDER — DROPERIDOL 2.5 MG/ML IJ SOLN: 0.6250 mg | Freq: Once | INTRAMUSCULAR | Status: DC | PRN

## 2022-06-23 MED ORDER — OXYCODONE HCL 5 MG PO TABS: 5.0000 mg | ORAL_TABLET | Freq: Once | ORAL | Status: AC | PRN

## 2022-06-23 MED ORDER — GLYCOPYRROLATE 0.2 MG/ML IJ SOLN
INTRAMUSCULAR | Status: DC | PRN
  Administered 2022-06-23: .2 mg via INTRAVENOUS

## 2022-06-23 MED ORDER — ACETAMINOPHEN 500 MG PO TABS
ORAL_TABLET | ORAL | Status: AC
  Filled 2022-06-23: qty 2

## 2022-06-23 MED ORDER — CELECOXIB 200 MG PO CAPS
400.0000 mg | ORAL_CAPSULE | ORAL | Status: AC
  Administered 2022-06-23: 400 mg via ORAL

## 2022-06-23 MED ORDER — DEXMEDETOMIDINE HCL IN NACL 200 MCG/50ML IV SOLN
INTRAVENOUS | Status: DC | PRN
  Administered 2022-06-23: 12 ug via INTRAVENOUS

## 2022-06-23 MED ORDER — IBUPROFEN 800 MG PO TABS: 800.0000 mg | ORAL_TABLET | Freq: Three times a day (TID) | ORAL | 1 refills | Status: DC | PRN

## 2022-06-23 MED ORDER — PHENYLEPHRINE 80 MCG/ML (10ML) SYRINGE FOR IV PUSH (FOR BLOOD PRESSURE SUPPORT)
PREFILLED_SYRINGE | INTRAVENOUS | Status: DC | PRN
  Administered 2022-06-23 (×4): 160 ug via INTRAVENOUS

## 2022-06-23 MED ORDER — LACTATED RINGERS IV SOLN: INTRAVENOUS | Status: DC | PRN

## 2022-06-23 MED ORDER — ESTRADIOL 0.05 MG/24HR TD PTWK
0.0500 mg | MEDICATED_PATCH | TRANSDERMAL | Status: DC
  Filled 2022-06-23: qty 1

## 2022-06-23 MED ORDER — PHENYLEPHRINE HCL-NACL 20-0.9 MG/250ML-% IV SOLN
INTRAVENOUS | Status: DC | PRN
  Administered 2022-06-23: 25 ug/min via INTRAVENOUS

## 2022-06-23 MED ORDER — CELECOXIB 200 MG PO CAPS
ORAL_CAPSULE | ORAL | Status: AC
  Filled 2022-06-23: qty 2

## 2022-06-23 MED ORDER — IBUPROFEN 800 MG PO TABS: 800.0000 mg | ORAL_TABLET | Freq: Three times a day (TID) | ORAL | 1 refills | Status: AC | PRN

## 2022-06-23 MED ORDER — PROMETHAZINE HCL 25 MG/ML IJ SOLN: 6.2500 mg | INTRAMUSCULAR | Status: DC | PRN

## 2022-06-23 MED ORDER — FAMOTIDINE 20 MG PO TABS
20.0000 mg | ORAL_TABLET | Freq: Once | ORAL | Status: AC
  Administered 2022-06-23: 20 mg via ORAL

## 2022-06-23 MED ORDER — FENTANYL CITRATE (PF) 100 MCG/2ML IJ SOLN
INTRAMUSCULAR | Status: AC
  Filled 2022-06-23: qty 2

## 2022-06-23 MED ORDER — FAMOTIDINE 20 MG PO TABS
ORAL_TABLET | ORAL | Status: AC
  Filled 2022-06-23: qty 1

## 2022-06-23 MED ORDER — VASOPRESSIN 20 UNIT/ML IV SOLN
INTRAVENOUS | Status: AC
  Filled 2022-06-23: qty 1

## 2022-06-23 MED ORDER — GABAPENTIN 300 MG PO CAPS
300.0000 mg | ORAL_CAPSULE | ORAL | Status: AC
  Administered 2022-06-23: 300 mg via ORAL

## 2022-06-23 MED ORDER — BUPIVACAINE HCL 0.5 % IJ SOLN
INTRAMUSCULAR | Status: DC | PRN
  Administered 2022-06-23: 15 mL

## 2022-06-23 MED ORDER — HYDROCODONE-ACETAMINOPHEN 5-325 MG PO TABS: 1.0000 | ORAL_TABLET | Freq: Four times a day (QID) | ORAL | 0 refills | Status: DC | PRN

## 2022-06-23 MED ORDER — CEFAZOLIN SODIUM-DEXTROSE 2-4 GM/100ML-% IV SOLN
INTRAVENOUS | Status: AC
  Filled 2022-06-23: qty 100

## 2022-06-23 MED ORDER — SODIUM CHLORIDE 0.9 % IV SOLN
250.0000 mg | Freq: Once | INTRAVENOUS | Status: AC
  Administered 2022-06-23: 250 mg via INTRAVENOUS
  Filled 2022-06-23: qty 20

## 2022-06-23 MED ORDER — HYDROCODONE-ACETAMINOPHEN 5-325 MG PO TABS
1.0000 | ORAL_TABLET | Freq: Once | ORAL | Status: AC
  Administered 2022-06-23: 1 via ORAL

## 2022-06-23 MED ORDER — FENTANYL CITRATE (PF) 100 MCG/2ML IJ SOLN
INTRAMUSCULAR | Status: AC
  Administered 2022-06-23: 25 ug via INTRAVENOUS
  Filled 2022-06-23: qty 2

## 2022-06-23 MED ORDER — CHLORHEXIDINE GLUCONATE 0.12 % MT SOLN
OROMUCOSAL | Status: AC
  Filled 2022-06-23: qty 15

## 2022-06-23 MED ORDER — ROCURONIUM BROMIDE 100 MG/10ML IV SOLN
INTRAVENOUS | Status: DC | PRN
  Administered 2022-06-23: 50 mg via INTRAVENOUS

## 2022-06-23 MED ORDER — FENTANYL CITRATE (PF) 100 MCG/2ML IJ SOLN
25.0000 ug | INTRAMUSCULAR | Status: DC | PRN
  Administered 2022-06-23 (×3): 25 ug via INTRAVENOUS

## 2022-06-23 MED ORDER — ACETAMINOPHEN 500 MG PO TABS
1000.0000 mg | ORAL_TABLET | ORAL | Status: AC
  Administered 2022-06-23: 1000 mg via ORAL

## 2022-06-23 MED ORDER — VASOPRESSIN 20 UNIT/ML IV SOLN
INTRAVENOUS | Status: DC | PRN
  Administered 2022-06-23: 19 mL

## 2022-06-23 MED ORDER — OXYCODONE HCL 5 MG PO TABS
ORAL_TABLET | ORAL | Status: AC
  Administered 2022-06-23: 5 mg via ORAL
  Filled 2022-06-23: qty 1

## 2022-06-23 MED ORDER — ESTRADIOL 0.05 MG/24HR TD PTWK: 0.0500 mg | MEDICATED_PATCH | TRANSDERMAL | 12 refills | Status: DC

## 2022-06-23 MED ORDER — DEXAMETHASONE SODIUM PHOSPHATE 10 MG/ML IJ SOLN
INTRAMUSCULAR | Status: DC | PRN
  Administered 2022-06-23: 10 mg via INTRAVENOUS

## 2022-06-23 MED ORDER — OXYCODONE HCL 5 MG/5ML PO SOLN: 5.0000 mg | Freq: Once | ORAL | Status: AC | PRN

## 2022-06-23 MED ORDER — KETAMINE HCL 50 MG/5ML IJ SOSY
PREFILLED_SYRINGE | INTRAMUSCULAR | Status: AC
  Filled 2022-06-23: qty 5

## 2022-06-23 MED ORDER — KETAMINE HCL 10 MG/ML IJ SOLN
INTRAMUSCULAR | Status: DC | PRN
  Administered 2022-06-23: 20 mg via INTRAVENOUS

## 2022-06-23 MED ORDER — 0.9 % SODIUM CHLORIDE (POUR BTL) OPTIME
TOPICAL | Status: DC | PRN
  Administered 2022-06-23: 50 mL

## 2022-06-23 MED ORDER — PHENYLEPHRINE HCL (PRESSORS) 10 MG/ML IV SOLN
INTRAVENOUS | Status: AC
  Filled 2022-06-23: qty 1

## 2022-06-23 MED ORDER — DOCUSATE SODIUM 100 MG PO CAPS: 100.0000 mg | ORAL_CAPSULE | Freq: Two times a day (BID) | ORAL | 2 refills | Status: DC | PRN

## 2022-06-23 MED ORDER — CEFAZOLIN SODIUM-DEXTROSE 2-4 GM/100ML-% IV SOLN
2.0000 g | INTRAVENOUS | Status: AC
  Administered 2022-06-23: 2 g via INTRAVENOUS

## 2022-06-23 MED ORDER — LIDOCAINE HCL (CARDIAC) PF 100 MG/5ML IV SOSY
PREFILLED_SYRINGE | INTRAVENOUS | Status: DC | PRN
  Administered 2022-06-23: 100 mg via INTRAVENOUS

## 2022-06-23 MED ORDER — GABAPENTIN 300 MG PO CAPS
ORAL_CAPSULE | ORAL | Status: AC
  Filled 2022-06-23: qty 1

## 2022-06-23 MED ORDER — MIDAZOLAM HCL 2 MG/2ML IJ SOLN
INTRAMUSCULAR | Status: DC | PRN
  Administered 2022-06-23: 2 mg via INTRAVENOUS

## 2022-06-23 MED ORDER — ACETAMINOPHEN 10 MG/ML IV SOLN: 1000.0000 mg | Freq: Once | INTRAVENOUS | Status: DC | PRN

## 2022-06-23 MED ORDER — EPHEDRINE SULFATE (PRESSORS) 50 MG/ML IJ SOLN
INTRAMUSCULAR | Status: DC | PRN
  Administered 2022-06-23: 5 mg via INTRAVENOUS

## 2022-06-23 MED ORDER — ONDANSETRON HCL 4 MG/2ML IJ SOLN
INTRAMUSCULAR | Status: DC | PRN
  Administered 2022-06-23: 4 mg via INTRAVENOUS

## 2022-06-23 MED ORDER — HYDROCODONE-ACETAMINOPHEN 5-325 MG PO TABS
ORAL_TABLET | ORAL | Status: AC
  Filled 2022-06-23: qty 1

## 2022-06-23 MED ORDER — FERROUS SULFATE 325 (65 FE) MG PO TABS: 325.0000 mg | ORAL_TABLET | Freq: Two times a day (BID) | ORAL | 1 refills | Status: DC

## 2022-06-23 MED ORDER — PROPOFOL 10 MG/ML IV BOLUS
INTRAVENOUS | Status: DC | PRN
  Administered 2022-06-23: 200 mg via INTRAVENOUS

## 2022-06-23 MED ORDER — MIDAZOLAM HCL 2 MG/2ML IJ SOLN
INTRAMUSCULAR | Status: AC
  Filled 2022-06-23: qty 2

## 2022-06-23 MED ORDER — BUPIVACAINE HCL (PF) 0.5 % IJ SOLN
INTRAMUSCULAR | Status: AC
  Filled 2022-06-23: qty 30

## 2022-06-23 MED ORDER — SUGAMMADEX SODIUM 500 MG/5ML IV SOLN
INTRAVENOUS | Status: DC | PRN
  Administered 2022-06-23 (×2): 100 mg via INTRAVENOUS

## 2022-06-23 MED ORDER — FENTANYL CITRATE (PF) 100 MCG/2ML IJ SOLN
INTRAMUSCULAR | Status: DC | PRN
  Administered 2022-06-23 (×2): 100 ug via INTRAVENOUS

## 2022-06-23 SURGICAL SUPPLY — 66 items
ADH SKN CLS APL DERMABOND .7 (GAUZE/BANDAGES/DRESSINGS) ×2
APL PRP STRL LF DISP 70% ISPRP (MISCELLANEOUS) ×2
BAG DRN RND TRDRP ANRFLXCHMBR (UROLOGICAL SUPPLIES) ×2
BAG URINE DRAIN 2000ML AR STRL (UROLOGICAL SUPPLIES) ×2 IMPLANT
BLADE SURG 15 STRL LF DISP TIS (BLADE) ×2 IMPLANT
BLADE SURG 15 STRL SS (BLADE) ×2
BLADE SURG SZ10 CARB STEEL (BLADE) ×2 IMPLANT
BLADE SURG SZ11 CARB STEEL (BLADE) ×2 IMPLANT
CATH FOLEY 2WAY  5CC 16FR (CATHETERS) ×2
CATH FOLEY 2WAY 5CC 16FR (CATHETERS) ×2
CATH URTH 16FR FL 2W BLN LF (CATHETERS) ×2 IMPLANT
CHLORAPREP W/TINT 26 (MISCELLANEOUS) ×2 IMPLANT
DERMABOND ADVANCED .7 DNX12 (GAUZE/BANDAGES/DRESSINGS) ×2 IMPLANT
ELECT REM PT RETURN 9FT ADLT (ELECTROSURGICAL) ×2
ELECTRODE REM PT RTRN 9FT ADLT (ELECTROSURGICAL) ×2 IMPLANT
GAUZE 4X4 16PLY ~~LOC~~+RFID DBL (SPONGE) ×6 IMPLANT
GAUZE PACK 2X3YD (PACKING) ×2 IMPLANT
GLOVE BIO SURGEON STRL SZ 6.5 (GLOVE) ×2 IMPLANT
GLOVE BIO SURGEON STRL SZ8 (GLOVE) ×4 IMPLANT
GLOVE PI ORTHO PRO STRL 7.5 (GLOVE) ×2 IMPLANT
GLOVE SURG POLY ORTHO LF SZ7.5 (GLOVE) ×2 IMPLANT
GLOVE SURG UNDER LTX SZ7 (GLOVE) ×4 IMPLANT
GOWN STRL REUS W/ TWL LRG LVL3 (GOWN DISPOSABLE) ×4 IMPLANT
GOWN STRL REUS W/ TWL XL LVL3 (GOWN DISPOSABLE) ×2 IMPLANT
GOWN STRL REUS W/TWL LRG LVL3 (GOWN DISPOSABLE) ×4
GOWN STRL REUS W/TWL XL LVL3 (GOWN DISPOSABLE) ×2
IRRIGATION STRYKERFLOW (MISCELLANEOUS) ×2 IMPLANT
IRRIGATOR STRYKERFLOW (MISCELLANEOUS)
IV LACTATED RINGERS 1000ML (IV SOLUTION) ×2 IMPLANT
KIT PINK PAD W/HEAD ARE REST (MISCELLANEOUS) ×2
KIT PINK PAD W/HEAD ARM REST (MISCELLANEOUS) ×2 IMPLANT
KIT TURNOVER CYSTO (KITS) ×2 IMPLANT
LABEL OR SOLS (LABEL) ×2 IMPLANT
LIGASURE LAP MARYLAND 5MM 37CM (ELECTROSURGICAL) ×2 IMPLANT
MANIFOLD NEPTUNE II (INSTRUMENTS) ×2 IMPLANT
MANIPULATOR VCARE LG CRV RETR (MISCELLANEOUS) IMPLANT
MANIPULATOR VCARE SML CRV RETR (MISCELLANEOUS) IMPLANT
MANIPULATOR VCARE STD CRV RETR (MISCELLANEOUS) IMPLANT
NDL SPNL 22GX3.5 QUINCKE BK (NEEDLE) ×2 IMPLANT
NEEDLE SPNL 22GX3.5 QUINCKE BK (NEEDLE) ×2 IMPLANT
PACK BASIN MINOR ARMC (MISCELLANEOUS) ×2 IMPLANT
PACK GYN LAPAROSCOPIC (MISCELLANEOUS) ×2 IMPLANT
PAD OB MATERNITY 4.3X12.25 (PERSONAL CARE ITEMS) ×2 IMPLANT
SCISSORS METZENBAUM CVD 33 (INSTRUMENTS) ×2 IMPLANT
SCRUB CHG 4% DYNA-HEX 4OZ (MISCELLANEOUS) ×2 IMPLANT
SHEARS HARMONIC ACE PLUS 36CM (ENDOMECHANICALS) IMPLANT
SLEEVE Z-THREAD 5X100MM (TROCAR) ×4 IMPLANT
STRIP CLOSURE SKIN 1/2X4 (GAUZE/BANDAGES/DRESSINGS) ×2 IMPLANT
SUT MNCRL 4-0 (SUTURE)
SUT MNCRL 4-0 27XMFL (SUTURE)
SUT VIC AB 0 CT1 27 (SUTURE) ×4
SUT VIC AB 0 CT1 27XCR 8 STRN (SUTURE) ×2 IMPLANT
SUT VIC AB 0 CT1 36 (SUTURE) ×2 IMPLANT
SUT VIC AB 0 CT2 27 (SUTURE) ×2 IMPLANT
SUT VIC AB 2-0 CT1 (SUTURE) ×2 IMPLANT
SUT VIC AB 4-0 FS2 27 (SUTURE) ×2 IMPLANT
SUTURE MNCRL 4-0 27XMF (SUTURE) ×2 IMPLANT
SYR 10ML LL (SYRINGE) ×2 IMPLANT
SYR 50ML LL SCALE MARK (SYRINGE) IMPLANT
SYR CONTROL 10ML LL (SYRINGE) ×2 IMPLANT
TAPE TRANSPORE STRL 2 31045 (GAUZE/BANDAGES/DRESSINGS) ×2 IMPLANT
TRAP FLUID SMOKE EVACUATOR (MISCELLANEOUS) ×2 IMPLANT
TROCAR XCEL NON-BLD 5MMX100MML (ENDOMECHANICALS) ×2 IMPLANT
TROCAR Z-THREAD OPTICAL 5X100M (TROCAR) IMPLANT
TUBING EVAC SMOKE HEATED PNEUM (TUBING) ×2 IMPLANT
WATER STERILE IRR 500ML POUR (IV SOLUTION) ×2 IMPLANT

## 2022-06-23 NOTE — Transfer of Care (Signed)
Immediate Anesthesia Transfer of Care Note  Patient: Misty Welch  Procedure(s) Performed: LAPAROSCOPIC ASSISTED VAGINAL HYSTERECTOMY WITH SALPINGECTOMY (Bilateral) LAPAROSCOPIC OOPHORECTOMY (Left)  Patient Location: PACU  Anesthesia Type:General  Level of Consciousness: drowsy  Airway & Oxygen Therapy: Patient Spontanous Breathing and Patient connected to nasal cannula oxygen  Post-op Assessment: Report given to RN and Post -op Vital signs reviewed and stable  Post vital signs: Reviewed and stable  Last Vitals:  Vitals Value Taken Time  BP 127/71 06/23/22 1655  Temp    Pulse 118 06/23/22 1659  Resp 0 06/23/22 1659  SpO2 100 % 06/23/22 1659  Vitals shown include unvalidated device data.  Last Pain:  Vitals:   06/23/22 1129  TempSrc: Temporal  PainSc: 0-No pain         Complications: No notable events documented.

## 2022-06-23 NOTE — Anesthesia Preprocedure Evaluation (Addendum)
Anesthesia Evaluation  Patient identified by MRN, date of birth, ID band Patient awake    Reviewed: Allergy & Precautions, NPO status , Patient's Chart, lab work & pertinent test results  Airway Mallampati: III  TM Distance: >3 FB Neck ROM: full    Dental  (+) Chipped   Pulmonary asthma (Mild exercise-induced asthma)    Pulmonary exam normal        Cardiovascular negative cardio ROS Normal cardiovascular exam     Neuro/Psych negative neurological ROS  negative psych ROS   GI/Hepatic Neg liver ROS,,,H/o Helicobacter positive gastritis   Endo/Other  negative endocrine ROS    Renal/GU      Musculoskeletal   Abdominal Normal abdominal exam  (+)   Peds  Hematology  (+) Blood dyscrasia, anemia   Anesthesia Other Findings Abnormal perimenopausal bleeding      Adenomyosis   Past Medical History: No date: Anemia No date: Cyst of ovary No date: Elevated cholesterol No date: Heavy menstrual bleeding 06/22/2017: Helicobacter positive gastritis 10/05/2019: History of vitamin D deficiency 10/05/2019: Menorrhagia 10/05/2019: Mild exercise-induced asthma 10/05/2019: Obesity (BMI 35.0-39.9 without comorbidity) No date: Peptic ulcer  Past Surgical History: 2003,2006: CESAREAN SECTION 2011: OOPHORECTOMY; Right No date: pyloric stenosis 2011: TUBAL LIGATION     Reproductive/Obstetrics negative OB ROS                             Anesthesia Physical Anesthesia Plan  ASA: 2  Anesthesia Plan: General   Post-op Pain Management: Tylenol PO (pre-op)*, Gabapentin PO (pre-op)* and Celebrex PO (pre-op)*   Induction: Intravenous  PONV Risk Score and Plan: 3 and Dexamethasone, Ondansetron, Midazolam, Treatment may vary due to age or medical condition and Droperidol  Airway Management Planned: Oral ETT  Additional Equipment:   Intra-op Plan:   Post-operative Plan: Extubation in  OR  Informed Consent: I have reviewed the patients History and Physical, chart, labs and discussed the procedure including the risks, benefits and alternatives for the proposed anesthesia with the patient or authorized representative who has indicated his/her understanding and acceptance.     Dental Advisory Given  Plan Discussed with: Anesthesiologist, CRNA and Surgeon  Anesthesia Plan Comments:         Anesthesia Quick Evaluation

## 2022-06-23 NOTE — H&P (Signed)
GYNECOLOGY PREOPERATIVE HISTORY AND PHYSICAL   Subjective:  Misty Welch is a 51 y.o. [phone] here for surgical management of abnormal perimenopausal bleeding, anemia, and adenomyosis. Patient with amenorrhea for ~ 1 year, then began resuming bleeding for ~ 5 months without cessation. Labs noted patient was no longer in menopause. Attempted management of bleeding with low-dose OCPs and NuvaRing without relief. Initially desired endometrial ablation however after ultrasound noted findings of adenomyosis, now desires definitve management. Patient also with a history of mild anemia. No significant preoperative concerns.  Proposed surgery: LAPAROSCOPIC ASSISTED VAGINAL HYSTERECTOMY, POSSIBLE LEFT OOPHORECTOMY     Pertinent Gynecological History:  Bleeding: Abnormal perimenopausal bleeding Contraception: post menopausal status Last mammogram: normal Date: 04/26/2021 Last pap: normal Date: 08/31/2018   Past Medical History:  Diagnosis Date   Anemia    Cyst of ovary    Elevated cholesterol    Heavy menstrual bleeding    Helicobacter positive gastritis 06/22/2017   History of vitamin D deficiency 10/05/2019   Menorrhagia 10/05/2019   Mild exercise-induced asthma 10/05/2019   Obesity (BMI 35.0-39.9 without comorbidity) 10/05/2019   Peptic ulcer     Past Surgical History:  Procedure Laterality Date   CESAREAN SECTION  2003,2006   OOPHORECTOMY Right 2011   pyloric stenosis     TUBAL LIGATION  2011   OB History  Gravida Para Term Preterm AB Living  2            SAB IAB Ectopic Multiple Live Births               # Outcome Date GA Lbr Len/2nd Weight Sex Delivery Anes PTL Lv  2 Gravida 2006    F CS-Unspec     1 Gravida 2003    M CS-Unspec       Family History  Problem Relation Age of Onset   Heart disease Maternal Grandmother    Alzheimer's disease Mother    Cirrhosis Father        non-alcoholic   Breast cancer Neg Hx     Social History   Socioeconomic History    Marital status: Married    Spouse name: Not on file   Number of children: Not on file   Years of education: Not on file   Highest education level: Not on file  Occupational History   Not on file  Tobacco Use   Smoking status: Never   Smokeless tobacco: Never  Vaping Use   Vaping Use: Never used  Substance and Sexual Activity   Alcohol use: No   Drug use: No   Sexual activity: Yes    Birth control/protection: Surgical  Other Topics Concern   Not on file  Social History Narrative   Not on file   Social Determinants of Health   Financial Resource Strain: Not on file  Food Insecurity: Not on file  Transportation Needs: Not on file  Physical Activity: Not on file  Stress: Not on file  Social Connections: Not on file  Intimate Partner Violence: Not on file    No current facility-administered medications on file prior to encounter.   Current Outpatient Medications on File Prior to Encounter  Medication Sig Dispense Refill   albuterol (VENTOLIN HFA) 108 (90 Base) MCG/ACT inhaler Inhale 1-2 puffs into the lungs every 6 (six) hours as needed for wheezing or shortness of breath. 8 g 3   norethindrone-ethinyl estradiol (LOESTRIN 1/20, 21,) 1-20 MG-MCG tablet Take 1 tablet by mouth daily.  Allergies  Allergen Reactions   Macrobid [Nitrofurantoin Monohyd Macro] Anaphylaxis   Nitrofurantoin Anaphylaxis   Ciprofloxacin     Dizzy spells and feeling emotional    Penicillins     Childhood reaction   Dilaudid [Hydromorphone] Anxiety and Other (See Comments)    "Feels like a crack addict"     Review of Systems Constitutional: No recent fever/chills/sweats Respiratory: No recent cough/bronchitis Cardiovascular: No chest pain Gastrointestinal: No recent nausea/vomiting/diarrhea Genitourinary: No UTI symptoms Hematologic/lymphatic:No history of coagulopathy or recent blood thinner use    Objective:   Blood pressure (!) 158/91, pulse 100, temperature 98.5 F (36.9 C),  temperature source Temporal, resp. rate 18, height 5\' 1"  (1.549 m), weight 78.5 kg, SpO2 100 %.  CONSTITUTIONAL: Well-developed, well-nourished female in no acute distress.  HENT:  Normocephalic, atraumatic, External right and left ear normal. Oropharynx is clear and moist EYES: Conjunctivae and EOM are normal. Pupils are equal, round, and reactive to light. No scleral icterus.  NECK: Normal range of motion, supple, no masses SKIN: Skin is warm and dry. No rash noted. Not diaphoretic. No erythema. No pallor. NEUROLOGIC: Alert and oriented to person, place, and time. Normal reflexes, muscle tone coordination. No cranial nerve deficit noted. PSYCHIATRIC: Normal mood and affect. Normal behavior. Normal judgment and thought content. CARDIOVASCULAR: Normal heart rate noted, regular rhythm RESPIRATORY: Effort and breath sounds normal, no problems with respiration noted ABDOMEN: Soft, nontender, nondistended. PELVIC: Deferred MUSCULOSKELETAL: Normal range of motion. No edema and no tenderness. 2+ distal pulses.    Labs: Lab Results  Component Value Date   WBC 7.4 06/16/2022   HGB 8.7 (L) 06/16/2022   HCT 30.6 (L) 06/16/2022   MCV 63.8 (L) 06/16/2022   PLT 399 06/16/2022     Imaging Studies: Patient Name: Misty Welch DOB: [birth year] MRN: [id number] LMP: bleeding x 6 weeks   ULTRASOUND REPORT   Location: [facility] Date of Service: 03/11/2022        Indications: Postmenopausal bleeding Findings:  The uterus is retroverted and measures 7.4 x 5.0 x 5.9 cm. Echo texture is heterogenous without evidence of focal masses. The uterus appears heterogeneous with tiny cystic changes throughout; suspicious for Adenomyosis.    The Endometrium measures 2.9 mm.     Right Ovary is surgically absent Left Ovary measures 2.2 x 1.7 x 1.6 cm. It is normal in appearance, although, possibly adherent to uterus (does not move when pushing with u/s probe, patient very  tender). Survey of the adnexa demonstrates no adnexal masses. There is mild free fluid in the cul de sac.   Impression: 1. Possible Adenomyosis 2. Possible adherent left ovary   Recommendations: 1.Clinical correlation with the patient's History and Physical Exam.     03/13/2022, RDMS, RVT      I have reviewed this study and agree with documented findings.      Willette Alma, MD [facility]   Assessment:    Abnormal uterine bleeding Suspected adenomyosis Anemia due to chronic blood loss  Plan:   - Patient desires definitive management with hysterectomy (LAVH with bilateral salpingectomy).  Currently no  indication for oophorectomy however discussed possibility of removal if ovary appeared diseased or suspected endometriosis present.  Patient now desires to have oophorectomy despite findings.  Discussed risk of surgical menopause, patient notes understanding of risks. The risks of surgery were discussed in detail with the patient including but not limited to: bleeding which may require transfusion or reoperation; infection which may require antibiotics; injury  to bowel, bladder, ureters or other surrounding organs; need for additional procedures including laparotomy or subsequent procedures secondary to abnormal pathology; formation of adhesions; thromboembolic phenomenon; incisional problems and other postoperative/anesthesia complications.  Patient was advised that she will be discharged home after surgery and expected recovery time after a hysterectomy is 6-8 weeks.  Patient was told that the likelihood that her condition and symptoms will be treated effectively with this surgical management was very high; the postoperative expectations were also discussed in detail. The patient also understands the alternative treatment options which have previously been discussed in full. All questions were answered.   - Preop testing reviewed.  Anemia noted. Discussed possibility  of iron infusion vs blood transfusion 1 unit PRBCs post-op.  - Patient has been NPO since midnight.  - All questions answered.      Hildred Laser, MD [facility]

## 2022-06-23 NOTE — Op Note (Signed)
Procedure(s): LAPAROSCOPIC ASSISTED VAGINAL HYSTERECTOMY WITH SALPINGECTOMY LAPAROSCOPIC OOPHORECTOMY Procedure Note  Misty Welch female 51 y.o. 06/23/2022  Indications: The patient is a 50 y.o. G94P0 female with abnormal perimenopausal uterine bleeding, anemia due to chronic blood loss, and adenomyosis. Previous history of C-section and tubal ligation. History of right oophorectomy.  Pre-operative Diagnosis:  abnormal perimenopausal uterine bleeding, anemia due to chronic blood loss, and adenomyosis.    Post-operative Diagnosis: Same with endometriosis  Surgeon: Hildred Laser, MD  Assistants:  Brennan Bailey, MD.   Anesthesia: General endotracheal anesthesia  Findings: The uterus was sounded to 7 cm Fallopian tubes previously surgically interrupted, but appear normal. Right ovary absent. Left ovary appears normal.  Powder-burn lesions noted on left anterior abdominal wall, anterior cul-de-sac,  and left pelvic sidewall.  Procedure Details: The patient was seen in the Holding Room. The risks, benefits, complications, treatment options, and expected outcomes were discussed with the patient.  The patient concurred with the proposed plan, giving informed consent.  The site of surgery properly noted/marked. The patient was taken to the Operating Room, identified as Misty Welch and the procedure verified as Procedure(s) (LRB): LAPAROSCOPIC ASSISTED VAGINAL HYSTERECTOMY WITH SALPINGECTOMY (Bilateral) LAPAROSCOPIC OOPHORECTOMY (Left).   She was then placed under general anesthesia without difficulty. She was placed in the dorsal lithotomy position, and was prepped and draped in a sterile manner. A Time Out was held and the above information confirmed.  A Foley catheter was inserted into her bladder, and attached to constant drainage.  A uterine manipulator was then advanced into the uterus .  After an adequate timeout was performed, attention was turned to the abdomen where an umbilical  incision was made with the scalpel.  The Optiview 5-mm trocar and sleeve were then advanced without difficulty with the laparoscope under direct visualization into the abdomen.  The abdomen was then insufflated with carbon dioxide gas and adequate pneumoperitoneum was obtained.  Bilateral 5-mm lower quadrant ports were then placed under direct visualization.  A survey of the patient's pelvis and abdomen revealed the findings as above.  Attention was turned to the infundibulopelvic ligament on the patient's left side, which was clamped using the Harmonic scalpel and ligated. The broad ligament was also ligated with the Harmonic scalpel working towards the round ligament.  The round and broad ligaments were then clamped and transected with the Harmonic scalpel.  The ureter were noted to be safely away from the area of dissection.  The uterine artery was then skeletonized and a bladder flap was created.  The bladder was then bluntly dissected off the lower uterine segment, however there was significant adhesions of the bladder flap to to the lower uterine segment.  At this point, attention was turned to the right uterine vessels, which were clamped and ligated using the Harmonic scalpel.  Attention was then turned to the patient's right side, which was treated in a similar manner by taking the round ligament and the broad ligaments and the bladder flap creation was completed. The right uterine vessels were also clamped and transected in a similar fashion. The decision was made to  proceed with completing the hysterectomy via the vaginal route.   The abdomen was desufflated and all instruments were then removed from the patient's abdomen.   All skin incisions were closed with subcuticular stitches of 4-0 Monocryl and Dermabond. The incisions were injected with a total of approximately 15 ml of 0.5% Marcaine.   Attention was then turned to her pelvis.  A weighted  speculum was then placed in the vagina, and the anterior  and posterior lips of the cervix were grasped bilaterally with tenaculums.  The cervix was then injected circumferentially with 0.5% Marcaine with epinephrine solution to maintain hemostasis.  The cervix was then circumferentially incised, and the posterior cul-de-sac was entered sharply without difficulty.  A long weighted speculum was inserted into the posterior cul-de-sac. The Heaney clamp was then used to clamp the uterosacral ligaments on either side.  They were then cut and sutured ligated with 0 Vicryl, and were held with a tag for later identification. Of note, all sutures used in this case were 0 Vicryl unless otherwise noted.   The cardinal ligaments were then clamped, cut and ligated bilaterally.  At this point, full entry into the anterior cul-de-sac was made, no injury to the bladder was noted.  The uterus, tubes and left ovary were freed from all ligaments and was then delivered and sent to pathology.  The vaginal cuff was then reperitonealized using a 0-Vicryl suture in a pursestring manner.  The vaginal cuff was closed in a series of figure-of-eight sutures with 0 Vicryl with care given to incorporate the uterosacral pedicles bilaterally.  All instruments were then removed from the pelvis.  All instruments, needles, and sponge counts were correct  times three. The patient was taken to the recovery room awake, extubated and in stable condition.   An experienced assistant was required given the standard of surgical care given the complexity of the case.  This assistant was needed for exposure, dissection, suctioning, retraction, instrument exchange, and for overall help during the procedure.   Estimated Blood Loss:  210 ml      Drains: foley catheterization during to procedure with  700 ml of clear urine         Total IV Fluids:   210 ml  Specimens: Uterus with cervix, fallopian tube and left ovary         Implants: None         Complications:  None; patient tolerated the procedure  well.         Disposition: PACU - hemodynamically stable.         Condition: stable   Hildred Laser, MD [facility]

## 2022-06-23 NOTE — Discharge Instructions (Addendum)

## 2022-06-23 NOTE — Anesthesia Procedure Notes (Signed)
Procedure Name: Intubation Date/Time: 06/23/2022 2:51 PM  Performed by: Mohammed Kindle, CRNAPre-anesthesia Checklist: Patient identified, Emergency Drugs available, Suction available and Patient being monitored Patient Re-evaluated:Patient Re-evaluated prior to induction Oxygen Delivery Method: Circle system utilized Preoxygenation: Pre-oxygenation with 100% oxygen Induction Type: IV induction Ventilation: Mask ventilation without difficulty Laryngoscope Size: McGraph and 3 Grade View: Grade I Tube type: Oral Number of attempts: 1 Airway Equipment and Method: Stylet and Oral airway Placement Confirmation: ETT inserted through vocal cords under direct vision, positive ETCO2, breath sounds checked- equal and bilateral and CO2 detector Secured at: 21 cm Tube secured with: Tape Dental Injury: Teeth and Oropharynx as per pre-operative assessment

## 2022-06-23 NOTE — Anesthesia Postprocedure Evaluation (Signed)
Anesthesia Post Note  Patient: Misty Welch  Procedure(s) Performed: LAPAROSCOPIC ASSISTED VAGINAL HYSTERECTOMY WITH SALPINGECTOMY (Bilateral) LAPAROSCOPIC OOPHORECTOMY (Left)  Patient location during evaluation: PACU Anesthesia Type: General Level of consciousness: awake and alert, oriented and patient cooperative Pain management: pain level controlled Vital Signs Assessment: post-procedure vital signs reviewed and stable Respiratory status: spontaneous breathing, nonlabored ventilation and respiratory function stable Cardiovascular status: blood pressure returned to baseline and stable Postop Assessment: adequate PO intake Anesthetic complications: no   No notable events documented.   Last Vitals:  Vitals:   06/23/22 1830 06/23/22 1845  BP: 121/83   Pulse: 84 85  Resp: 15 (!) 21  Temp:    SpO2: 100% 100%    Last Pain:  Vitals:   06/23/22 1800  TempSrc:   PainSc: 3                  Reed Breech

## 2022-06-24 ENCOUNTER — Encounter: Payer: Self-pay | Admitting: Obstetrics and Gynecology

## 2022-06-26 LAB — SURGICAL PATHOLOGY

## 2022-06-27 NOTE — Progress Notes (Unsigned)
OBSTETRICS/GYNECOLOGY POST-OPERATIVE CLINIC VISIT  Subjective:     Misty Welch is a 51 y.o. female who presents to the clinic  1.1  weeks status post laparoscopic assisted vaginal hysterectomy, left oophorectomy, and bilateral salpingectomy for Abnormal perimenopausal bleeding/Adenomyosis . Eating a regular diet {with-without:5700} difficulty. Bowel movements are {normal/abnormal***:19619}. {pain control:13522::"The patient is not having any pain."}  {Common ambulatory SmartLinks:19316}  Review of Systems {ros; complete:30496}   Objective:   There were no vitals taken for this visit. There is no height or weight on file to calculate BMI.  General:  alert and no distress  Abdomen: soft, bowel sounds active, non-tender  Incision:   {incision:13716::"no dehiscence","incision well approximated","healing well","no drainage","no erythema","no hernia","no seroma","no swelling"}    Pathology:   SURGICAL PATHOLOGY CASE: [id number] PATIENT: Misty Welch Surgical Pathology Report     Specimen Submitted: A. Uterus, cervix, bil tubes, left ovary  Clinical History: Abnormal perimenopausal bleeding/adenomyosis      DIAGNOSIS: A.  UTERUS WITH CERVIX AND BILATERAL FALLOPIAN TUBES, LEFT OVARY; TOTAL HYSTERECTOMY WITH BILATERAL SALPINGECTOMY AND LEFT OOPHORECTOMY: - SEROSA WITH BENIGN MESOTHELIAL INCLUSION CYSTS. - CHRONIC CERVICITIS WITH CILIATED METAPLASIA. - WEAKLY PROLIFERATIVE ENDOMETRIUM. - UNREMARKABLE MYOMETRIUM. - LEFT FALLOPIAN TUBE WITH FIMBRIATED END AND BENIGN PARATUBAL CYST. - DEFINITE RIGHT FALLOPIAN TUBE TISSUE NOT IDENTIFIED. - LEFT OVARY WITH FOLLICULAR CYST. - NEGATIVE FOR MALIGNANCY.  Comment: Per [facility] the patient is status post bilateral tubal ligation. Definite residual right fallopian tube tissue is not identified. Deeper and additional sections were reviewed.  GROSS DESCRIPTION: A. Labeled: Uterus, cervix, bilateral tubes, left  ovary Received: Fresh Collection time: 4:25 PM on 06/23/2022 Placed into formalin time: 5:46 PM on 06/23/2022 Weight: 98.5 grams Dimensions:      Fundus -7.4 (superior to inferior) by 5.4 (breadth of uterus at fundus) by 4.0 (anterior to posterior) cm      Cervix -the cervix is 3.9 x 3.5 cm with a 0.5 x 0.4 cm cervical os Serosa: The serosa is tan to pink and roughened with diffuse adherent smooth-walled cystic structures ranging from 0.1 to 1.0 cm Cervix: The cervix is tan-pink and glistening Endocervix: The endocervix is pink, mucoid and finely granular Endometrial cavity:      Dimensions -2.5 x 2.5 cm      Thickness -0.2 cm      Other findings -the endometrium is pink, smooth and hemorrhagic Myometrium:     Thickness -2.0 cm     Other findings -the myometrium is pink and trabeculated Adnexa:      Possible right fallopian tube           Measurements -2.8 cm in length x 0.5 cm in diameter           Other findings -received attached to the uterus is a possible proximal portion of right fallopian tube.  No fimbriae are identified      Left ovary            Weight -5.4 grams           Measurement -2.7 x 1.9 x 1.7 cm           Serosa -tan to yellow and smooth           Cut surface -sectioning shows tan cut surfaces with a smooth-walled cyst, up to 1.6 cm and multiple corpora albicantia      Left fallopian tube            Measurements -2.6 cm in length x 0.6 cm in  diameter           Other findings -received is a tan to purple fimbriated fallopian tube with a 0.4 x 0.3 x 0.3 cm paratubal cyst.  Sectioning shows tan cut surfaces with a pinpoint lumen. Other comments: None noted  Block summary: 1 -representative serosal cystic structures 2-representative anterior cervix and endocervix 3-representative posterior cervix and endocervix 4-representative anterior endomyometrium 5-representative posterior endomyometrium 6- possible right fallopian tube, entirely  submitted 7-representative left fallopian tube and entire fimbriated end 8-representative left ovary    Assessment:   Patient s/p *** (surgery)  {doing well:13525::"Doing well postoperatively."}   Plan:   1. Continue any current medications as instructed by provider. 2. Wound care discussed. 3. Operative findings again reviewed. Pathology report discussed. 4. Activity restrictions: {restrictions:13723} 5. Anticipated return to work: {work return:14002}. 6. Follow up: {1-74:08144} {time; units:18646} for ***    Hildred Laser, MD [facility]

## 2022-07-01 ENCOUNTER — Encounter: Payer: Self-pay | Admitting: Obstetrics and Gynecology

## 2022-07-01 ENCOUNTER — Ambulatory Visit (INDEPENDENT_AMBULATORY_CARE_PROVIDER_SITE_OTHER): Payer: 59 | Admitting: Obstetrics and Gynecology

## 2022-07-01 VITALS — BP 132/79 | HR 84 | Resp 16 | Ht 61.0 in | Wt 178.7 lb

## 2022-07-01 DIAGNOSIS — E8940 Asymptomatic postprocedural ovarian failure: Secondary | ICD-10-CM

## 2022-07-01 DIAGNOSIS — Z90710 Acquired absence of both cervix and uterus: Secondary | ICD-10-CM

## 2022-07-01 DIAGNOSIS — Z4889 Encounter for other specified surgical aftercare: Secondary | ICD-10-CM

## 2022-07-15 ENCOUNTER — Other Ambulatory Visit: Payer: Self-pay | Admitting: Obstetrics and Gynecology

## 2022-07-22 ENCOUNTER — Telehealth: Payer: Self-pay

## 2022-07-22 NOTE — Telephone Encounter (Signed)
Advise to continue to monitor, if bleeding becomes heavier, or she begins to experience other urinary symptoms (painful urination, frequency, back pain this would warrant a visit.

## 2022-07-22 NOTE — Telephone Encounter (Signed)
Patient contacted office stating that it has been 4 weeks now since she has had a hysterectomy, patient reports that in the past 24hrs she has noticed light vaginal bleeding. Patient describes blood as bright red in color and states that she has been wearing a panty liner, patient states that she only notices blood when wiping. Patient denies symptoms of pelvic pain, abdominal pain nausea/vomiting or fever.Patient would like to know if this is normal or expected after surgery? Or is a visit needed for evaluation? KW

## 2022-07-23 NOTE — Telephone Encounter (Signed)
Patient has been advised. KW 

## 2022-07-24 ENCOUNTER — Encounter: Payer: Self-pay | Admitting: Obstetrics and Gynecology

## 2022-07-24 ENCOUNTER — Ambulatory Visit (INDEPENDENT_AMBULATORY_CARE_PROVIDER_SITE_OTHER): Payer: 59 | Admitting: Obstetrics and Gynecology

## 2022-07-24 VITALS — BP 124/94 | HR 102 | Resp 16 | Ht 61.0 in | Wt 180.4 lb

## 2022-07-24 DIAGNOSIS — Z90710 Acquired absence of both cervix and uterus: Secondary | ICD-10-CM

## 2022-07-24 DIAGNOSIS — N99820 Postprocedural hemorrhage and hematoma of a genitourinary system organ or structure following a genitourinary system procedure: Secondary | ICD-10-CM

## 2022-07-24 DIAGNOSIS — Z4889 Encounter for other specified surgical aftercare: Secondary | ICD-10-CM

## 2022-07-24 NOTE — Progress Notes (Signed)
    OBSTETRICS/GYNECOLOGY POST-OPERATIVE CLINIC VISIT  Subjective:     Misty Welch is a 52 y.o. female who presents to the clinic 4 weeks status post laparoscopic assisted vaginal hysterectomy, left oophorectomy, and bilateral salpingectomy  due to complaints of  vaginal bleeding for the past 2 days. Notes initially she noted only spotting when wiping, however over the day progressed to light bleeding. Yesterday she noted feeling a gush, and had several small clots.  Of note, she does note more activity lately, including taking down Christmas decorations, cleaning her home (vacuuming/laundry), and went to work yesterday where she had to climb ~ 20-30 steps.     The following portions of the patient's history were reviewed and updated as appropriate: allergies, current medications, past family history, past medical history, past social history, past surgical history, and problem list.  Review of Systems Pertinent items noted in HPI and remainder of comprehensive ROS otherwise negative.   Objective:   BP (!) 124/94   Pulse (!) 102   Resp 16   Ht 5\' 1"  (1.549 m)   Wt 180 lb 6.4 oz (81.8 kg)   BMI 34.09 kg/m  Body mass index is 34.09 kg/m.  General:  alert and no distress  Abdomen: soft, bowel sounds active, non-tender  Pelvis:   external genitalia normal, rectovaginal septum normal.  Vagina with scant blood in vaginal vault and 1 small nickel-sized clot. Suture material present. Vaginal cuff is intact.   Bimanual exam not performed.       Assessment:   Patient s/p laparoscopic assisted vaginal hysterectomy, left oophorectomy, and bilateral salpingectomy (surgery)  Post-operative vaginal bleeding   Plan:   1. Discussed with patient that likely cause of bleeding was due to increased recent physical activity.  Advised to limit strenuous activity for the next 2 weeks Given reassurance that normal healing was occurring.  2.  Follow up in 2 weeks for final post-op check.      Rubie Maid, MD [place]

## 2022-08-04 NOTE — Progress Notes (Unsigned)
    OBSTETRICS/GYNECOLOGY POST-OPERATIVE CLINIC VISIT  Subjective:     Misty Welch is a 52 y.o. female who presents to the clinic 6 weeks status post  status post laparoscopic assisted vaginal hysterectomy, left oophorectomy, and bilateral salpingectomy  for  vaginal bleeding . Eating a regular diet {with-without:5700} difficulty. Bowel movements are {normal/abnormal***:19619}. {pain control:13522::"The patient is not having any pain."}  {Common ambulatory SmartLinks:19316}  Review of Systems {ros; complete:30496}   Objective:   There were no vitals taken for this visit. There is no height or weight on file to calculate BMI.  General:  alert and no distress  Abdomen: soft, bowel sounds active, non-tender  Incision:   healing well, no drainage, no erythema, no hernia, no seroma, no swelling, no dehiscence, incision well approximated    Pathology:    Assessment:   Patient s/p  status post laparoscopic assisted vaginal hysterectomy, left oophorectomy, and bilateral salpingectomy  (surgery)  {doing well:13525::"Doing well postoperatively."}   Plan:   1. Continue any current medications as instructed by provider. 2. Wound care discussed. 3. Operative findings again reviewed. Pathology report discussed. 4. Activity restrictions: {restrictions:13723} 5. Anticipated return to work: {work return:14002}. 6. Follow up: {2-19:75883} {time; units:18646} for ***    Rubie Maid, MD [place]

## 2022-08-05 ENCOUNTER — Encounter: Payer: Self-pay | Admitting: Obstetrics and Gynecology

## 2022-08-05 ENCOUNTER — Ambulatory Visit (INDEPENDENT_AMBULATORY_CARE_PROVIDER_SITE_OTHER): Payer: 59 | Admitting: Obstetrics and Gynecology

## 2022-08-05 VITALS — BP 131/90 | HR 84 | Resp 16 | Ht 61.0 in | Wt 181.8 lb

## 2022-08-05 DIAGNOSIS — Z90710 Acquired absence of both cervix and uterus: Secondary | ICD-10-CM

## 2022-08-05 DIAGNOSIS — N898 Other specified noninflammatory disorders of vagina: Secondary | ICD-10-CM

## 2022-08-05 DIAGNOSIS — Z4889 Encounter for other specified surgical aftercare: Secondary | ICD-10-CM

## 2023-05-20 ENCOUNTER — Other Ambulatory Visit: Payer: Self-pay

## 2023-05-20 DIAGNOSIS — Z1231 Encounter for screening mammogram for malignant neoplasm of breast: Secondary | ICD-10-CM

## 2023-05-26 ENCOUNTER — Telehealth: Payer: Self-pay

## 2023-05-26 NOTE — Telephone Encounter (Signed)
Pt calling; has UTI; does she need to be seen or can [facility]'s nurse call in something?  Adv would need to be seen; will send msg to schedulers to schedule a nurse visit.

## 2023-05-26 NOTE — Telephone Encounter (Addendum)
I contacted the patient via phone to offer an appointment. I offer tomorrow in office with Dr Valentino Saxon at 2:35 for an office visit. The patient declined and asked "if she could do a urine drop off due to her schedule being busy this week". No nurse scheduled for today nor tomorrow. I asked if the patient would be willing to see someone else. The patient requested again to do a urine drop off.Please advise?

## 2023-05-27 ENCOUNTER — Ambulatory Visit: Payer: 59

## 2023-05-27 VITALS — BP 159/110 | HR 86 | Temp 97.3°F | Ht 61.0 in | Wt 180.1 lb

## 2023-05-27 DIAGNOSIS — R300 Dysuria: Secondary | ICD-10-CM | POA: Diagnosis not present

## 2023-05-27 DIAGNOSIS — N3001 Acute cystitis with hematuria: Secondary | ICD-10-CM

## 2023-05-27 LAB — POCT URINALYSIS DIPSTICK
Appearance: ABNORMAL
Bilirubin, UA: NEGATIVE
Glucose, UA: NEGATIVE
Ketones, UA: NEGATIVE
Nitrite, UA: NEGATIVE
Odor: NORMAL
Protein, UA: NEGATIVE
Spec Grav, UA: 1.01 (ref 1.010–1.025)
Urobilinogen, UA: 0.2 U/dL
pH, UA: 6 (ref 5.0–8.0)

## 2023-05-27 MED ORDER — SULFAMETHOXAZOLE-TRIMETHOPRIM 800-160 MG PO TABS: 1.0000 | ORAL_TABLET | Freq: Two times a day (BID) | ORAL | 0 refills | Status: AC

## 2023-05-27 NOTE — Progress Notes (Signed)
    NURSE VISIT NOTE  Subjective:    Patient ID: Misty Welch, female    DOB: [birth year], 52 y.o.   MRN: [id number]       HPI  Patient is a 52 y.o. G59P0 female who presents for dysuria and pelvic pain for 6 days.  Patient denies flank pain.  Patient does not have a history of recurrent UTI.  Patient does not have a history of pyelonephritis. Blood pressure elevated today. Patient reports she has had sinus congestion for about a week and also had a cortisol drink last night that was loaded with sodium.   Objective:    BP (!) 148/95   Pulse 86   Temp (!) 97.3 F (36.3 C)   Ht 5\' 1"  (1.549 m)   Wt 180 lb 1.6 oz (81.7 kg)   BMI 34.03 kg/m    Lab Review  Results for orders placed or performed in visit on 05/27/23  POCT Urinalysis Dipstick  Result Value Ref Range   Color, UA yellow    Clarity, UA cloudy    Glucose, UA Negative Negative   Bilirubin, UA Negative    Ketones, UA Negative    Spec Grav, UA 1.010 1.010 - 1.025   Blood, UA 2+    pH, UA 6.0 5.0 - 8.0   Protein, UA Negative Negative   Urobilinogen, UA 0.2 0.2 or 1.0 E.U./dL   Nitrite, UA Negative    Leukocytes, UA Large (3+) (A) Negative   Appearance abnormal    Odor normal     Assessment:   1. Acute cystitis with hematuria   2. Dysuria      Plan:   Urine Culture Sent. Treatment  Bactrim DS 1 PO BID for 7 days. Maintain adequate hydration.  May use AZO OTC prn.  Follow up if symptoms worsen or fail to improve as anticipated, and as needed.  Patient advised per Dr. Valentino Saxon to monitor blood pressure and report back any reading >140/90.   She is aware to report to ED with headache unrelieved by tylenol or acompanied with visual disturbances.   Rocco Serene, LPN

## 2023-05-27 NOTE — Patient Instructions (Signed)
Urinary Tract Infection, Adult A urinary tract infection (UTI) is an infection of any part of the urinary tract. The urinary tract includes: The kidneys. The ureters. The bladder. The urethra. These organs make, store, and get rid of pee (urine) in the body. What are the causes? This infection is caused by germs (bacteria) in your genital area. These germs grow and cause swelling (inflammation) of your urinary tract. What increases the risk? The following factors may make you more likely to develop this condition: Using a small, thin tube (catheter) to drain pee. Not being able to control when you pee or poop (incontinence). Being female. If you are female, these things can increase the risk: Using these methods to prevent pregnancy: A medicine that kills sperm (spermicide). A device that blocks sperm (diaphragm). Having low levels of a female hormone (estrogen). Being pregnant. You are more likely to develop this condition if: You have genes that add to your risk. You are sexually active. You take antibiotic medicines. You have trouble peeing because of: A prostate that is bigger than normal, if you are female. A blockage in the part of your body that drains pee from the bladder. A kidney stone. A nerve condition that affects your bladder. Not getting enough to drink. Not peeing often enough. You have other conditions, such as: Diabetes. A weak disease-fighting system (immune system). Sickle cell disease. Gout. Injury of the spine. What are the signs or symptoms? Symptoms of this condition include: Needing to pee right away. Peeing small amounts often. Pain or burning when peeing. Blood in the pee. Pee that smells bad or not like normal. Trouble peeing. Pee that is cloudy. Fluid coming from the vagina, if you are female. Pain in the belly or lower back. Other symptoms include: Vomiting. Not feeling hungry. Feeling mixed up (confused). This may be the first symptom in  older adults. Being tired and grouchy (irritable). A fever. Watery poop (diarrhea). How is this treated? Taking antibiotic medicine. Taking other medicines. Drinking enough water. In some cases, you may need to see a specialist. Follow these instructions at home:  Medicines Take over-the-counter and prescription medicines only as told by your doctor. If you were prescribed an antibiotic medicine, take it as told by your doctor. Do not stop taking it even if you start to feel better. General instructions Make sure you: Pee until your bladder is empty. Do not hold pee for a long time. Empty your bladder after sex. Wipe from front to back after peeing or pooping if you are a female. Use each tissue one time when you wipe. Drink enough fluid to keep your pee pale yellow. Keep all follow-up visits. Contact a doctor if: You do not get better after 1-2 days. Your symptoms go away and then come back. Get help right away if: You have very bad back pain. You have very bad pain in your lower belly. You have a fever. You have chills. You feeling like you will vomit or you vomit. Summary A urinary tract infection (UTI) is an infection of any part of the urinary tract. This condition is caused by germs in your genital area. There are many risk factors for a UTI. Treatment includes antibiotic medicines. Drink enough fluid to keep your pee pale yellow. This information is not intended to replace advice given to you by your health care provider. Make sure you discuss any questions you have with your health care provider. Document Revised: 02/12/2020 Document Reviewed: 02/17/2020 Elsevier Patient Education    2024 Elsevier Inc.  

## 2023-05-27 NOTE — Telephone Encounter (Signed)
Spoke with the patient and she is on her way. Is okay with waiting to be seen.

## 2023-05-30 LAB — URINE CULTURE

## 2023-06-04 ENCOUNTER — Ambulatory Visit
Admission: RE | Admit: 2023-06-04 | Discharge: 2023-06-04 | Disposition: A | Discharge status: Home / Self Care | Payer: 59 | Source: Ambulatory Visit | Attending: Obstetrics and Gynecology | Admitting: Obstetrics and Gynecology

## 2023-06-04 DIAGNOSIS — Z1231 Encounter for screening mammogram for malignant neoplasm of breast: Secondary | ICD-10-CM | POA: Diagnosis present

## 2023-10-01 ENCOUNTER — Telehealth: Admitting: Family Medicine

## 2023-10-01 DIAGNOSIS — B9689 Other specified bacterial agents as the cause of diseases classified elsewhere: Secondary | ICD-10-CM

## 2023-10-01 DIAGNOSIS — J019 Acute sinusitis, unspecified: Secondary | ICD-10-CM

## 2023-10-01 MED ORDER — DOXYCYCLINE HYCLATE 100 MG PO TABS: 100.0000 mg | ORAL_TABLET | Freq: Two times a day (BID) | ORAL | 0 refills | Status: AC

## 2023-10-01 MED ORDER — PREDNISONE 20 MG PO TABS: 20.0000 mg | ORAL_TABLET | Freq: Two times a day (BID) | ORAL | 0 refills | Status: AC

## 2023-10-01 NOTE — Patient Instructions (Signed)

## 2023-10-01 NOTE — Progress Notes (Signed)
 Virtual Visit Consent   [name], you are scheduled for a virtual visit with a [facility] provider today. Just as with appointments in the office, your consent must be obtained to participate. Your consent will be active for this visit and any virtual visit you may have with one of our providers in the next 365 days. If you have a MyChart account, a copy of this consent can be sent to you electronically.  As this is a virtual visit, video technology does not allow for your provider to perform a traditional examination. This may limit your provider's ability to fully assess your condition. If your provider identifies any concerns that need to be evaluated in person or the need to arrange testing (such as labs, EKG, etc.), we will make arrangements to do so. Although advances in technology are sophisticated, we cannot ensure that it will always work on either your end or our end. If the connection with a video visit is poor, the visit may have to be switched to a telephone visit. With either a video or telephone visit, we are not always able to ensure that we have a secure connection.  By engaging in this virtual visit, you consent to the provision of healthcare and authorize for your insurance to be billed (if applicable) for the services provided during this visit. Depending on your insurance coverage, you may receive a charge related to this service.  I need to obtain your verbal consent now. Are you willing to proceed with your visit today? [name] has provided verbal consent on 10/01/2023 for a virtual visit (video or telephone). Georgana Curio, FNP  Date: 10/01/2023 7:35 PM   Virtual Visit via Video Note   I, Georgana Curio, connected with  Misty Welch  ([id number], 05/26/1971) on 10/01/23 at  7:30 PM EDT by a video-enabled telemedicine application and verified that I am speaking with the correct person using two identifiers.  Location: Patient: Virtual Visit Location Patient:  Home Provider: Virtual Visit Location Provider: Home Office   I discussed the limitations of evaluation and management by telemedicine and the availability of in person appointments. The patient expressed understanding and agreed to proceed.    History of Present Illness: Misty Welch is a 53 y.o. who identifies as a female who was assigned female at birth, and is being seen today for sinus pressure and pain. Yellow mucus. No fever. She had a zpack a few weeks ago and the sinuses are no better.   HPI: HPI  Problems:  Patient Active Problem List   Diagnosis Date Noted   Mild exercise-induced asthma 10/05/2019   Menorrhagia 10/05/2019   History of vitamin D deficiency 10/05/2019   Dyslipidemia 10/05/2019   Gastroesophageal reflux disease with esophagitis 10/05/2019   Obesity (BMI 35.0-39.9 without comorbidity) 10/05/2019   Helicobacter positive gastritis 06/22/2017   Abdominal pain 05/23/2017   Vitamin D deficiency 07/22/2016    Allergies:  Allergies  Allergen Reactions   Macrobid [Nitrofurantoin Monohyd Macro] Anaphylaxis   Nitrofurantoin Anaphylaxis   Ciprofloxacin     Dizzy spells and feeling emotional    Penicillins     Childhood reaction   Dilaudid [Hydromorphone] Anxiety and Other (See Comments)    "Feels like a crack addict"   Medications:  Current Outpatient Medications:    albuterol (VENTOLIN HFA) 108 (90 Base) MCG/ACT inhaler, Inhale 1-2 puffs into the lungs every 6 (six) hours as needed for wheezing or shortness of breath., Disp: 8 g, Rfl: 3  docusate sodium (COLACE) 100 MG capsule, Take 1 capsule (100 mg total) by mouth 2 (two) times daily as needed., Disp: 30 capsule, Rfl: 2   ferrous sulfate 325 (65 FE) MG tablet, TAKE 1 TABLET BY MOUTH TWICE A DAY, Disp: 180 tablet, Rfl: 1   ibuprofen (ADVIL) 800 MG tablet, Take 1 tablet (800 mg total) by mouth every 8 (eight) hours as needed., Disp: 60 tablet, Rfl: 1  Observations/Objective: Patient is well-developed,  well-nourished in no acute distress.  Resting comfortably  at home.  Head is normocephalic, atraumatic.  No labored breathing.  Speech is clear and coherent with logical content.  Patient is alert and oriented at baseline.    Assessment and Plan: 1. Acute bacterial sinusitis (Primary)  Increase fluids, otc allergy meds, UC if sx persist or worsen.  Follow Up Instructions: I discussed the assessment and treatment plan with the patient. The patient was provided an opportunity to ask questions and all were answered. The patient agreed with the plan and demonstrated an understanding of the instructions.  A copy of instructions were sent to the patient via MyChart unless otherwise noted below.     The patient was advised to call back or seek an in-person evaluation if the symptoms worsen or if the condition fails to improve as anticipated.    Georgana Curio, FNP

## 2024-01-04 ENCOUNTER — Ambulatory Visit (INDEPENDENT_AMBULATORY_CARE_PROVIDER_SITE_OTHER): Admitting: Certified Nurse Midwife

## 2024-01-04 ENCOUNTER — Encounter: Payer: Self-pay | Admitting: Certified Nurse Midwife

## 2024-01-04 VITALS — BP 144/88 | HR 86 | Ht 61.0 in | Wt 174.7 lb

## 2024-01-04 DIAGNOSIS — Z01419 Encounter for gynecological examination (general) (routine) without abnormal findings: Secondary | ICD-10-CM | POA: Diagnosis not present

## 2024-01-04 DIAGNOSIS — Z13220 Encounter for screening for lipoid disorders: Secondary | ICD-10-CM

## 2024-01-04 DIAGNOSIS — Z131 Encounter for screening for diabetes mellitus: Secondary | ICD-10-CM

## 2024-01-04 DIAGNOSIS — Z1329 Encounter for screening for other suspected endocrine disorder: Secondary | ICD-10-CM

## 2024-01-04 DIAGNOSIS — Z130 Encounter for screening for diseases of the blood and blood-forming organs and certain disorders involving the immune mechanism: Secondary | ICD-10-CM

## 2024-01-04 DIAGNOSIS — Z1231 Encounter for screening mammogram for malignant neoplasm of breast: Secondary | ICD-10-CM

## 2024-01-04 MED ORDER — ALBUTEROL SULFATE HFA 108 (90 BASE) MCG/ACT IN AERS: 1.0000 | INHALATION_SPRAY | Freq: Four times a day (QID) | RESPIRATORY_TRACT | 3 refills | Status: AC | PRN

## 2024-01-04 NOTE — Patient Instructions (Signed)
 Preventive Care 16-52 Years Old, Female  Preventive care refers to lifestyle choices and visits with your health care provider that can promote health and wellness. Preventive care visits are also called wellness exams.  What can I expect for my preventive care visit?  Counseling  Your health care provider may ask you questions about your:  Medical history, including:  Past medical problems.  Family medical history.  Pregnancy history.  Current health, including:  Menstrual cycle.  Method of birth control.  Emotional well-being.  Home life and relationship well-being.  Sexual activity and sexual health.  Lifestyle, including:  Alcohol, nicotine or tobacco, and drug use.  Access to firearms.  Diet, exercise, and sleep habits.  Work and work Astronomer.  Sunscreen use.  Safety issues such as seatbelt and bike helmet use.  Physical exam  Your health care provider will check your:  Height and weight. These may be used to calculate your BMI (body mass index). BMI is a measurement that tells if you are at a healthy weight.  Waist circumference. This measures the distance around your waistline. This measurement also tells if you are at a healthy weight and may help predict your risk of certain diseases, such as type 2 diabetes and high blood pressure.  Heart rate and blood pressure.  Body temperature.  Skin for abnormal spots.  What immunizations do I need?    Vaccines are usually given at various ages, according to a schedule. Your health care provider will recommend vaccines for you based on your age, medical history, and lifestyle or other factors, such as travel or where you work.  What tests do I need?  Screening  Your health care provider may recommend screening tests for certain conditions. This may include:  Lipid and cholesterol levels.  Diabetes screening. This is done by checking your blood sugar (glucose) after you have not eaten for a while (fasting).  Pelvic exam and Pap test.  Hepatitis B test.  Hepatitis C  test.  HIV (human immunodeficiency virus) test.  STI (sexually transmitted infection) testing, if you are at risk.  Lung cancer screening.  Colorectal cancer screening.  Mammogram. Talk with your health care provider about when you should start having regular mammograms. This may depend on whether you have a family history of breast cancer.  BRCA-related cancer screening. This may be done if you have a family history of breast, ovarian, tubal, or peritoneal cancers.  Bone density scan. This is done to screen for osteoporosis.  Talk with your health care provider about your test results, treatment options, and if necessary, the need for more tests.  Follow these instructions at home:  Eating and drinking    Eat a diet that includes fresh fruits and vegetables, whole grains, lean protein, and low-fat dairy products.  Take vitamin and mineral supplements as recommended by your health care provider.  Do not drink alcohol if:  Your health care provider tells you not to drink.  You are pregnant, may be pregnant, or are planning to become pregnant.  If you drink alcohol:  Limit how much you have to 0-1 drink a day.  Know how much alcohol is in your drink. In the [place] one drink equals one 12 oz bottle of beer (355 mL), one 5 oz glass of wine (148 mL), or one 1 oz glass of hard liquor (44 mL).  Lifestyle  Brush your teeth every morning and night with fluoride toothpaste. Floss one time each day.  Exercise for at least  30 minutes 5 or more days each week.  Do not use any products that contain nicotine or tobacco. These products include cigarettes, chewing tobacco, and vaping devices, such as e-cigarettes. If you need help quitting, ask your health care provider.  Do not use drugs.  If you are sexually active, practice safe sex. Use a condom or other form of protection to prevent STIs.  If you do not wish to become pregnant, use a form of birth control. If you plan to become pregnant, see your health care provider for a  prepregnancy visit.  Take aspirin only as told by your health care provider. Make sure that you understand how much to take and what form to take. Work with your health care provider to find out whether it is safe and beneficial for you to take aspirin daily.  Find healthy ways to manage stress, such as:  Meditation, yoga, or listening to music.  Journaling.  Talking to a trusted person.  Spending time with friends and family.  Minimize exposure to UV radiation to reduce your risk of skin cancer.  Safety  Always wear your seat belt while driving or riding in a vehicle.  Do not drive:  If you have been drinking alcohol. Do not ride with someone who has been drinking.  When you are tired or distracted.  While texting.  If you have been using any mind-altering substances or drugs.  Wear a helmet and other protective equipment during sports activities.  If you have firearms in your house, make sure you follow all gun safety procedures.  Seek help if you have been physically or sexually abused.  What's next?  Visit your health care provider once a year for an annual wellness visit.  Ask your health care provider how often you should have your eyes and teeth checked.  Stay up to date on all vaccines.  This information is not intended to replace advice given to you by your health care provider. Make sure you discuss any questions you have with your health care provider.  Document Revised: 01/02/2021 Document Reviewed: 01/02/2021  Elsevier Patient Education  2024 [employer].

## 2024-01-04 NOTE — Progress Notes (Signed)
 GYNECOLOGY ANNUAL PREVENTATIVE CARE ENCOUNTER NOTE  History:     Misty Welch is a 53 y.o. G2P0 female here for a routine annual gynecologic exam.  Current complaints: discuss change in hormones.   Denies abnormal vaginal bleeding, discharge, pelvic pain, problems with intercourse or other gynecologic concerns.     Social Relationship:married Living: husband and daughter ( daughter will be leaving in Fall for college) Work: full time Biochemist, clinical   Exercise:3-4x a week Smoke/Alcohol/drug use:No/ socially/ No  Gynecologic History No LMP recorded. (Menstrual status: Irregular Periods). Contraception: status post hysterectomy Last Pap: 08/31/2018. Results were: normal with negative HPV Last mammogram: 06/04/2023. Results were: normal  Obstetric History OB History  Gravida Para Term Preterm AB Living  2       SAB IAB Ectopic Multiple Live Births          # Outcome Date GA Lbr Len/2nd Weight Sex Type Anes PTL Lv  2 Gravida 2006    F CS-Unspec     1 Gravida 2003    M CS-Unspec       Past Medical History:  Diagnosis Date   Anemia    Cyst of ovary    Elevated cholesterol    Heavy menstrual bleeding    Helicobacter positive gastritis 06/22/2017   History of vitamin D  deficiency 10/05/2019   Menorrhagia 10/05/2019   Mild exercise-induced asthma 10/05/2019   Obesity (BMI 35.0-39.9 without comorbidity) 10/05/2019   Peptic ulcer     Past Surgical History:  Procedure Laterality Date   CESAREAN SECTION  2003,2006   LAPAROSCOPIC VAGINAL HYSTERECTOMY WITH SALPINGECTOMY Bilateral 06/23/2022   Procedure: LAPAROSCOPIC ASSISTED VAGINAL HYSTERECTOMY WITH SALPINGECTOMY;  Surgeon: Teresa Fender, MD;  Location: [facility] ORS;  Service: Gynecology;  Laterality: Bilateral;   OOPHORECTOMY Right 2011   pyloric stenosis     TUBAL LIGATION  2011    Current Outpatient Medications on File Prior to Visit  Medication Sig Dispense Refill   albuterol  (VENTOLIN  HFA) 108 (90 Base)  MCG/ACT inhaler Inhale 1-2 puffs into the lungs every 6 (six) hours as needed for wheezing or shortness of breath. 8 g 3   docusate sodium  (COLACE) 100 MG capsule Take 1 capsule (100 mg total) by mouth 2 (two) times daily as needed. 30 capsule 2   ferrous sulfate  325 (65 FE) MG tablet TAKE 1 TABLET BY MOUTH TWICE A DAY 180 tablet 1   ibuprofen  (ADVIL ) 800 MG tablet Take 1 tablet (800 mg total) by mouth every 8 (eight) hours as needed. 60 tablet 1   No current facility-administered medications on file prior to visit.    Allergies  Allergen Reactions   Macrobid [Nitrofurantoin Monohyd Macro] Anaphylaxis   Nitrofurantoin Anaphylaxis   Ciprofloxacin      Dizzy spells and feeling emotional    Penicillins     Childhood reaction   Dilaudid  [Hydromorphone ] Anxiety and Other (See Comments)    Feels like a crack addict    Social History:  reports that she has never smoked. She has never used smokeless tobacco. She reports that she does not drink alcohol and does not use drugs.  Family History  Problem Relation Age of Onset   Heart disease Maternal Grandmother    Alzheimer's disease Mother    Cirrhosis Father        non-alcoholic   Breast cancer Neg Hx     The following portions of the patient's history were reviewed and updated as appropriate: allergies, current medications, past  family history, past medical history, past social history, past surgical history and problem list.  Review of Systems Pertinent items noted in HPI and remainder of comprehensive ROS otherwise negative.  Physical Exam:  There were no vitals taken for this visit. CONSTITUTIONAL: Well-developed, well-nourished female in no acute distress.  HENT:  Normocephalic, atraumatic, External right and left ear normal. Oropharynx is clear and moist EYES: Conjunctivae and EOM are normal. Pupils are equal, round, and reactive to light. No scleral icterus.  NECK: Normal range of motion, supple, no masses.  Normal thyroid .   SKIN: Skin is warm and dry. No rash noted. Not diaphoretic. No erythema. No pallor. MUSCULOSKELETAL: Normal range of motion. No tenderness.  No cyanosis, clubbing, or edema.  2+ distal pulses. NEUROLOGIC: Alert and oriented to person, place, and time. Normal reflexes, muscle tone coordination.  PSYCHIATRIC: Normal mood and affect. Normal behavior. Normal judgment and thought content. CARDIOVASCULAR: Normal heart rate noted, regular rhythm RESPIRATORY: Clear to auscultation bilaterally. Effort and breath sounds normal, no problems with respiration noted. BREASTS: Symmetric in size. No masses, tenderness, skin changes, nipple drainage, or lymphadenopathy bilaterally.  ABDOMEN: Soft, no distention noted.  No tenderness, rebound or guarding.  PELVIC: pt declined exam today, has had hysterectomy and denies any vaginal issues.   Assessment and Plan:   Annual Well Women GYN Exam   Pap: n/a  Mammogram : ordered Colonoscopy: pt declines , declines colo guard  Labs: Tsh/T4, lipid, CBC, HemA1c Refills: albuterol  inhaler Referral:none Discussed menopause self help measures. Reviewed medication options. PT declines prescription options. Information given on herbal supplements. Routine preventative health maintenance measures emphasized. Please refer to After Visit Summary for other counseling recommendations.     BP elevated repeat improved but sill elevated. Pt encouraged to follow up with BP check in 1 week.   Alise Appl, CNM [facility]  [facility],  [facility]

## 2024-01-05 LAB — HEMOGLOBIN A1C
Est. average glucose Bld gHb Est-mCnc: 105 mg/dL
Hgb A1c MFr Bld: 5.3 % (ref 4.8–5.6)

## 2024-01-05 LAB — LIPID PANEL
Chol/HDL Ratio: 5.8 ratio — ABNORMAL HIGH (ref 0.0–4.4)
Cholesterol, Total: 326 mg/dL — ABNORMAL HIGH (ref 100–199)
HDL: 56 mg/dL (ref >39)
LDL Chol Calc (NIH): 260 mg/dL — ABNORMAL HIGH (ref 0–99)
Triglycerides: 72 mg/dL (ref 0–149)
VLDL Cholesterol Cal: 10 mg/dL (ref 5–40)

## 2024-01-05 LAB — CBC
Hematocrit: 41.3 % (ref 34.0–46.6)
Hemoglobin: 13.6 g/dL (ref 11.1–15.9)
MCH: 28.4 pg (ref 26.6–33.0)
MCHC: 32.9 g/dL (ref 31.5–35.7)
MCV: 86 fL (ref 79–97)
Platelets: 315 10*3/uL (ref 150–450)
RBC: 4.79 x10E6/uL (ref 3.77–5.28)
RDW: 12.4 % (ref 11.7–15.4)
WBC: 7.3 10*3/uL (ref 3.4–10.8)

## 2024-01-05 LAB — TSH+FREE T4
Free T4: 1.23 ng/dL (ref 0.82–1.77)
TSH: 1.5 u[IU]/mL (ref 0.450–4.500)

## 2024-01-07 ENCOUNTER — Ambulatory Visit: Payer: Self-pay | Admitting: Certified Nurse Midwife

## 2024-02-08 ENCOUNTER — Ambulatory Visit (INDEPENDENT_AMBULATORY_CARE_PROVIDER_SITE_OTHER)

## 2024-02-08 VITALS — BP 160/92 | HR 94 | Ht 61.0 in | Wt 180.5 lb

## 2024-02-08 DIAGNOSIS — E669 Obesity, unspecified: Secondary | ICD-10-CM

## 2024-02-08 DIAGNOSIS — E785 Hyperlipidemia, unspecified: Secondary | ICD-10-CM | POA: Diagnosis not present

## 2024-02-08 DIAGNOSIS — I10 Essential (primary) hypertension: Secondary | ICD-10-CM | POA: Insufficient documentation

## 2024-02-08 DIAGNOSIS — E7801 Familial hypercholesterolemia: Secondary | ICD-10-CM | POA: Insufficient documentation

## 2024-02-08 DIAGNOSIS — I159 Secondary hypertension, unspecified: Secondary | ICD-10-CM | POA: Diagnosis not present

## 2024-02-08 DIAGNOSIS — Z780 Asymptomatic menopausal state: Secondary | ICD-10-CM | POA: Insufficient documentation

## 2024-02-08 MED ORDER — PRAVASTATIN SODIUM 20 MG PO TABS: 20.0000 mg | ORAL_TABLET | Freq: Every day | ORAL | 1 refills | Status: AC

## 2024-02-08 NOTE — Progress Notes (Unsigned)
 New Patient Visit   Physician: [name] [name], MD  Patient: Misty Welch   DOB: 20-Mar-1971   53 y.o. Female  MRN: [id number] Visit Date: 02/08/2024   Chief Complaint  Patient presents with   Establish Care   Subjective  Misty Welch is a 53 y.o. female who presents today as a new patient to establish care.    HPI  Patient has history of hyperlipidemia.  Her cholesterol more recently drawn was for the total cholesterol 326 LDL of 260.  Triglycerides in normal range.  Patient did try rosuvastatin  and did find that she had diffuse muscle aches with this medication and therefore stopped the medication.  She reports a family history of hyperlipidemia.  No immediate family history of early myocardial infarction or stroke.  Patient had more basic labs done including CBC hemoglobin A1c TSH/T4 without major abnormality.  Patient is overweight with a BMI of 34.11 and a weight of 180 pounds.  She does exercise 4 days a week.  Patient reporting some postmenopausal mood changes and significant mood swings.  She has tried more naturopathic type supplements without effect.  She does work in [employer], [employer] party and does work significant hours.  Difficulty getting free time away from work.  Patient reports normal sleep she usually gets 7 to 8 hours nightly but does awake at times.  Patient blood pressure elevated in office 160/92.  She reports that at previous doctor visits her blood pressure has been elevated.  She has not been on medication in the past.  Patient reports diet overall healthy and she does report report normal portions.  She does have a history of iron deficiency and vitamin D  deficiency in the past.  Patient denies any chest pain palpitations or exertional shortness of breath.    Assessment & Plan   Problem List Items Addressed This Visit       Cardiovascular and Mediastinum   Hypertension     Other   Dyslipidemia - Primary   Obesity (BMI 35.0-39.9 without  comorbidity)   Familial hypercholesteremia   Menopause   1.  Hyperlipidemia.  Will trial lower dose pravastatin  to see how she does with this.  If this is ineffective we can move to ezetimibe and she may require injectable therapy particularly if we find that she has coronary artery disease.  For restratification I do recommend a coronary calcium  scoring which she will consider  2.  Mood changes with menopause.  She may benefit from low-dose estrogen supplementation.  But she is overall low risk.  I will send her literature on this topic.  She has had a total hysterectomy.  3.  Obesity.  Discussed diet and exercise.  Increase protein intake for lean protein where she is able to.  4.  Hypertension patient to keep blood pressure log and follow-up in 3 weeks.  If she has had undiagnosed hypertension for some time she is more likely higher cardiovascular risk.    Objective  BP (!) 160/92 (BP Location: Left Arm, Patient Position: Sitting, Cuff Size: Normal)   Pulse 94   Ht 5' 1 (1.549 m)   Wt 180 lb 8 oz (81.9 kg)   LMP 03/19/2022 (Approximate)   SpO2 98%   BMI 34.11 kg/m  {Insert last BP/Wt (optional):23777}{See vitals history (optional):1}  Patient to follow-up in 3 weeks with blood pressure log.  We did not discuss asthma diagnosis in chart or her H. pylori gastritis and we will discuss this at next visit.  Plan otherwise to obtain labs again towards the fall particularly if we start new medication. Should check serum iron/vitamin D  since she has deficiency of this in the past  Review of Systems  Constitutional:  Negative for chills, fever and weight loss.  Eyes:  Negative for blurred vision. h Respiratory:  Negative for cough and shortness of breath.   Cardiovascular:  Negative for chest pain and palpitations.  Skin:  Negative for rash.  Psychiatric/Behavioral:  Negative for depression. The patient is not nervous/anxious.      Physical Exam Physical Exam Vitals reviewed.   Constitutional:      Appearance: Normal appearance. Well-developed with normal weight.  HENT:     Head: Normocephalic and atraumatic.  Normal mucous membranes, no oral lesions Eyes:     Pupils: Pupils are equal, round, and reactive to light.  Neck:     Thyroid : No thyroid  mass or thyromegaly.  Cardiovascular:     Rate and Rhythm: Normal rate and regular rhythm. Normal heart sounds. Normal peripheral pulses Pulmonary:     Normal breath sounds with normal effort Abdominal:   Abdomen is soft, without tenderness or noted hepatosplenomegaly Musculoskeletal:        General: No swelling or edema  Lymphadenopathy:     Cervical: No cervical adenopathy.  Skin:    General: Skin is warm and dry without noticeable rash. Neurological:     General: No focal deficit present.  Psychiatric:        Mood and Affect: Mood, behavior and cognition normal   Past Medical History:  Diagnosis Date   Anemia    Cyst of ovary    Elevated cholesterol    Heavy menstrual bleeding    Helicobacter positive gastritis 06/22/2017   History of vitamin D  deficiency 10/05/2019   Menorrhagia 10/05/2019   Mild exercise-induced asthma 10/05/2019   Obesity (BMI 35.0-39.9 without comorbidity) 10/05/2019   Peptic ulcer    Past Surgical History:  Procedure Laterality Date   CESAREAN SECTION  2003,2006   LAPAROSCOPIC VAGINAL HYSTERECTOMY WITH SALPINGECTOMY Bilateral 06/23/2022   Procedure: LAPAROSCOPIC ASSISTED VAGINAL HYSTERECTOMY WITH SALPINGECTOMY;  Surgeon: Connell Davies, MD;  Location: [facility] ORS;  Service: Gynecology;  Laterality: Bilateral;   OOPHORECTOMY Right 2011   pyloric stenosis     TUBAL LIGATION  2011   Family Status  Relation Name Status   MGM  (Not Specified)   Mother  Deceased   Father  Deceased   Neg Hx  (Not Specified)  No partnership data on file   Family History  Problem Relation Age of Onset   Heart disease Maternal Grandmother    Alzheimer's disease Mother    Cirrhosis Father         non-alcoholic   Breast cancer Neg Hx    Social History   Socioeconomic History   Marital status: Married    Spouse name: Not on file   Number of children: Not on file   Years of education: Not on file   Highest education level: Not on file  Occupational History   Not on file  Tobacco Use   Smoking status: Never   Smokeless tobacco: Never  Vaping Use   Vaping status: Never Used  Substance and Sexual Activity   Alcohol use: No   Drug use: No   Sexual activity: Yes    Birth control/protection: Surgical  Other Topics Concern   Not on file  Social History Narrative   Not on file   Social Drivers of Health  Financial Resource Strain: Not on file  Food Insecurity: Not on file  Transportation Needs: Not on file  Physical Activity: Not on file  Stress: Not on file  Social Connections: Not on file   Outpatient Medications Prior to Visit  Medication Sig   albuterol  (VENTOLIN  HFA) 108 (90 Base) MCG/ACT inhaler Inhale 1-2 puffs into the lungs every 6 (six) hours as needed for wheezing or shortness of breath.   ibuprofen  (ADVIL ) 800 MG tablet Take 1 tablet (800 mg total) by mouth every 8 (eight) hours as needed.   Probiotic Product (PROBIOTIC BLEND PO) Take by mouth.   No facility-administered medications prior to visit.   Allergies  Allergen Reactions   Macrobid [Nitrofurantoin Monohyd Macro] Anaphylaxis   Nitrofurantoin Anaphylaxis   Ciprofloxacin      Dizzy spells and feeling emotional    Penicillins     Childhood reaction   Dilaudid  [Hydromorphone ] Anxiety and Other (See Comments)    Feels like a crack addict    Immunization History  Administered Date(s) Administered   Tdap 08/23/2020    Health Maintenance  Topic Date Due   Pneumococcal Vaccine 5-36 Years old (1 of 2 - PCV) Never done   Hepatitis B Vaccines (1 of 3 - 19+ 3-dose series) Never done   Colonoscopy  Never done   Zoster Vaccines- Shingrix (1 of 2) Never done   COVID-19 Vaccine (1 - 2024-25  season) Never done   Cervical Cancer Screening (HPV/Pap Cotest)  09/01/2023   INFLUENZA VACCINE  02/19/2024   MAMMOGRAM  06/03/2024   DTaP/Tdap/Td (2 - Td or Tdap) 08/23/2030   Hepatitis C Screening  Completed   HIV Screening  Completed   HPV VACCINES  Aged Out   Meningococcal B Vaccine  Aged Out    Patient Care Team: Patient, No Pcp Per as PCP - General (General Practice)  Depression Screen    04/29/2022    8:31 AM  PHQ 2/9 Scores  PHQ - 2 Score 0     Parris DELENA Juneau, MD  [facility] [phone] (phone) [phone] (fax)  [facility]

## 2024-03-07 ENCOUNTER — Ambulatory Visit

## 2024-04-07 ENCOUNTER — Ambulatory Visit

## 2024-05-04 ENCOUNTER — Other Ambulatory Visit: Payer: Self-pay

## 2024-05-04 DIAGNOSIS — E785 Hyperlipidemia, unspecified: Secondary | ICD-10-CM

## 2024-05-05 ENCOUNTER — Other Ambulatory Visit

## 2024-05-05 DIAGNOSIS — E785 Hyperlipidemia, unspecified: Secondary | ICD-10-CM

## 2024-05-06 LAB — LIPID PANEL
Cholesterol: 265 mg/dL — ABNORMAL HIGH (ref <200)
HDL: 64 mg/dL (ref >=50)
LDL Cholesterol (Calc): 184 mg/dL — ABNORMAL HIGH
Non-HDL Cholesterol (Calc): 201 mg/dL — ABNORMAL HIGH (ref <130)
Total CHOL/HDL Ratio: 4.1 (calc) (ref <5.0)
Triglycerides: 73 mg/dL (ref <150)

## 2024-05-11 ENCOUNTER — Telehealth: Payer: Self-pay

## 2024-05-11 NOTE — Telephone Encounter (Signed)
 Copied from CRM #[id number]. Topic: Appointments - Scheduling Inquiry for Clinic >> May 11, 2024 10:56 AM Ivette P wrote: Reason for CRM: Pt states she was unaware about the lab work on 10/16 and does not want to be charged for it.

## 2024-05-13 ENCOUNTER — Ambulatory Visit

## 2024-06-21 ENCOUNTER — Ambulatory Visit
Admission: RE | Admit: 2024-06-21 | Discharge: 2024-06-21 | Disposition: A | Source: Ambulatory Visit | Attending: Certified Nurse Midwife

## 2024-06-21 DIAGNOSIS — Z1231 Encounter for screening mammogram for malignant neoplasm of breast: Secondary | ICD-10-CM | POA: Insufficient documentation

## 2024-06-21 DIAGNOSIS — Z01419 Encounter for gynecological examination (general) (routine) without abnormal findings: Secondary | ICD-10-CM

## 2024-07-12 ENCOUNTER — Telehealth: Payer: Self-pay

## 2024-07-12 ENCOUNTER — Encounter: Payer: Self-pay | Admitting: Obstetrics & Gynecology

## 2024-07-12 ENCOUNTER — Ambulatory Visit: Admitting: Obstetrics & Gynecology

## 2024-07-12 VITALS — BP 138/83 | HR 109 | Ht 61.0 in | Wt 181.0 lb

## 2024-07-12 DIAGNOSIS — N952 Postmenopausal atrophic vaginitis: Secondary | ICD-10-CM | POA: Diagnosis not present

## 2024-07-12 DIAGNOSIS — N950 Postmenopausal bleeding: Secondary | ICD-10-CM

## 2024-07-12 DIAGNOSIS — Z90710 Acquired absence of both cervix and uterus: Secondary | ICD-10-CM

## 2024-07-12 MED ORDER — ESTRADIOL 7.5 MCG/24HR VA RING: 1.0000 | VAGINAL_RING | VAGINAL | 4 refills | Status: DC

## 2024-07-12 MED ORDER — ESTRADIOL 0.01 % VA CREA: TOPICAL_CREAM | VAGINAL | 12 refills | Status: AC

## 2024-07-12 NOTE — Telephone Encounter (Signed)
 Patient states she saw Dr. Starla today and was rx'd   estradiol  (ESTRING ) 7.5 MCG/24HR vaginal ring  Patient rcvd notification that it is $85. Inquiring if she can try the cream instead due to cost.

## 2024-07-12 NOTE — Addendum Note (Signed)
 Addended by: STARLA HARLAND BROCKS on: 07/12/2024 04:10 PM   Modules accepted: Orders

## 2024-07-12 NOTE — Progress Notes (Addendum)
" ° ° °  GYNECOLOGY PROGRESS NOTE  Subjective:    Patient ID: Misty Welch, female    DOB: 1970/07/28, 53 y.o.   MRN: [id number]  HPI  Patient is a 53 y.o. here because she recently saw a spot of blood from her vagina. She had a LAVH/BS/left oopherectomy 06/23/2022. She used a Qtip and is sure that the blood was from the vaginal vault. She had previously had sex a few days before this. The bleeding has stopped as of today.  The following portions of the patient's history were reviewed and updated as appropriate: allergies, current medications, past family history, past medical history, past social history, past surgical history, and problem list.  Review of Systems Pertinent items are noted in HPI.  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 There is no height or weight on file to calculate BMI. Well nourished, well hydrated White female, no apparent distress She is ambulating and conversing normally. EG- shaved, normal Graves plastic speculum used. The entire vagina and cuff are normal but with atrophy.   Assessment:  PMB s/p hysterectomy VVA  Plan:   Treat with estring   I was notified later that this would cost her $85. She wants to try cream instead. "

## 2024-08-11 ENCOUNTER — Ambulatory Visit

## 2024-09-12 ENCOUNTER — Ambulatory Visit
